# Patient Record
Sex: Male | Born: 1960 | Race: Black or African American | Hispanic: No | Marital: Married | Smoking: Former smoker
Health system: Southern US, Community
[De-identification: ages and names within clinical notes are randomized; demographics above are authoritative.]

## PROBLEM LIST (undated history)

## (undated) DIAGNOSIS — E041 Nontoxic single thyroid nodule: Secondary | ICD-10-CM

## (undated) HISTORY — DX: Nontoxic single thyroid nodule: E04.1

---

## 1992-03-28 HISTORY — PX: KNEE ARTHROSCOPY W/ MENISCECTOMY: SHX1879

## 2005-12-20 ENCOUNTER — Ambulatory Visit: Payer: Self-pay | Admitting: Family Medicine

## 2005-12-27 ENCOUNTER — Ambulatory Visit: Payer: Self-pay | Admitting: Family Medicine

## 2007-01-01 ENCOUNTER — Ambulatory Visit: Payer: Self-pay | Admitting: Family Medicine

## 2007-01-08 ENCOUNTER — Ambulatory Visit: Payer: Self-pay | Admitting: Family Medicine

## 2008-01-15 ENCOUNTER — Ambulatory Visit: Payer: Self-pay | Admitting: Family Medicine

## 2009-01-14 ENCOUNTER — Ambulatory Visit: Payer: Self-pay | Admitting: Family Medicine

## 2009-03-28 HISTORY — PX: COLONOSCOPY: SHX174

## 2010-01-14 ENCOUNTER — Encounter (INDEPENDENT_AMBULATORY_CARE_PROVIDER_SITE_OTHER): Payer: Self-pay | Admitting: *Deleted

## 2010-01-14 ENCOUNTER — Ambulatory Visit: Payer: Self-pay | Admitting: Family Medicine

## 2010-02-05 ENCOUNTER — Encounter (INDEPENDENT_AMBULATORY_CARE_PROVIDER_SITE_OTHER): Payer: Self-pay | Admitting: *Deleted

## 2010-02-09 ENCOUNTER — Ambulatory Visit: Payer: Self-pay | Admitting: Gastroenterology

## 2010-03-19 ENCOUNTER — Ambulatory Visit: Payer: Self-pay | Admitting: Gastroenterology

## 2010-03-23 LAB — HM COLONOSCOPY

## 2010-03-24 ENCOUNTER — Encounter: Payer: Self-pay | Admitting: Gastroenterology

## 2010-04-27 NOTE — Miscellaneous (Signed)
Summary: [facility]  Clinical Lists Changes  Medications: Added new medication of MOVIPREP 100 GM  SOLR (PEG-KCL-NACL-NASULF-NA ASC-C) As per prep instructions. - Signed Rx of MOVIPREP 100 GM  SOLR (PEG-KCL-NACL-NASULF-NA ASC-C) As per prep instructions.;  #1 x 0;  Signed;  Entered by: Ezra Sites RN;  Authorized by: Meryl Dare MD [facility];  Method used: [facility]  [address] [id number] [address] [place], [place], [place]  [address], Ph: [phone], Fax: [phone] Observations: Added new observation of NKA: T (02/09/2010 11:09)    Prescriptions: MOVIPREP 100 GM  SOLR (PEG-KCL-NACL-NASULF-NA ASC-C) As per prep instructions.  #1 x 0   Entered by:   Ezra Sites RN   Authorized by:   Meryl Dare MD [facility]   Signed by:   Ezra Sites RN on 02/09/2010   Method used:   [facility]        [facility]  [address] [id number]       [address]       [place], [place]  [address]       Ph: [phone]       Fax: [phone]   RxID:   [id number]

## 2010-04-27 NOTE — Letter (Signed)
Summary: Pre Visit Letter Revised  [facility]  [address] [place], [place] [address]   Phone: [phone]  Fax: [phone]        01/14/2010 MRN: [id number] [facility] [address] [place], [place]  [address]             Procedure Date:  03/02/2010   Welcome to the Gastroenterology Division at [facility].    You are scheduled to see a nurse for your pre-procedure visit on 02/09/2010 at 11:00AM on the 3rd floor at [facility], [address]  We ask that you try to arrive at our office 15 minutes prior to your appointment time to allow for check-in.  Please take a minute to review the attached form.  If you answer "Yes" to one or more of the questions on the first page, we ask that you call the person listed at your earliest opportunity.  If you answer "No" to all of the questions, please complete the rest of the form and bring it to your appointment.    Your nurse visit will consist of discussing your medical and surgical history, your immediate family medical history, and your medications.   If you are unable to list all of your medications on the form, please bring the medication bottles to your appointment and we will list them.  We will need to be aware of both prescribed and over the counter drugs.  We will need to know exact dosage information as well.    Please be prepared to read and sign documents such as consent forms, a financial agreement, and acknowledgement forms.  If necessary, and with your consent, a friend or relative is welcome to sit-in on the nurse visit with you.  Please bring your insurance card so that we may make a copy of it.  If your insurance requires a referral to see a specialist, please bring your referral form from your primary care physician.  No co-pay is required for this nurse visit.     If you cannot keep your appointment, please call [phone] to cancel or reschedule prior to your appointment date.  This  allows [place] the opportunity to schedule an appointment for another patient in need of care.    Thank you for choosing [facility] for your medical needs.  We appreciate the opportunity to care for you.  Please visit [place] at our website  to learn more about our practice.  Sincerely, The Gastroenterology Division

## 2010-04-27 NOTE — Letter (Signed)
Summary: [facility] Instructions  [facility]  [address] [place], [place] [address]   Phone: [phone]  Fax: [phone]       Benjamin Macias    Apr 25, 1960    MRN: [id number]        Procedure Day /Date: Friday 03/19/2010     Arrival Time: 12:30 pm     Procedure Time: 1:30 pm     Location of Procedure:                    _x _  [facility] (4th Floor)                        PREPARATION FOR COLONOSCOPY WITH MOVIPREP   Starting 5 days prior to your procedure  Sunday 12/18 do not eat nuts, seeds, popcorn, corn, beans, peas,  salads, or any raw vegetables.  Do not take any fiber supplements (e.g. Metamucil, Citrucel, and Benefiber).  THE DAY BEFORE YOUR PROCEDURE         DATE:  Thursday 12/22  1.  Drink clear liquids the entire day-NO SOLID FOOD  2.  Do not drink anything colored red or purple.  Avoid juices with pulp.  No orange juice.  3.  Drink at least 64 oz. (8 glasses) of fluid/clear liquids during the day to prevent dehydration and help the prep work efficiently.  CLEAR LIQUIDS INCLUDE: Water Jello Ice Popsicles Tea (sugar ok, no milk/cream) Powdered fruit flavored drinks Coffee (sugar ok, no milk/cream) Gatorade Juice: apple, white grape, white cranberry  Lemonade Clear bullion, consomm, broth Carbonated beverages (any kind) Strained chicken noodle soup Hard Candy                             4.  In the morning, mix first dose of MoviPrep solution:    Empty 1 Pouch A and 1 Pouch B into the disposable container    Add lukewarm drinking water to the top line of the container. Mix to dissolve    Refrigerate (mixed solution should be used within 24 hrs)  5.  Begin drinking the prep at 5:00 p.m. The MoviPrep container is divided by 4 marks.   Every 15 minutes drink the solution down to the next mark (approximately 8 oz) until the full liter is complete.   6.  Follow completed prep with 16 oz of clear liquid of your choice  (Nothing red or purple).  Continue to drink clear liquids until bedtime.  7.  Before going to bed, mix second dose of MoviPrep solution:    Empty 1 Pouch A and 1 Pouch B into the disposable container    Add lukewarm drinking water to the top line of the container. Mix to dissolve    Refrigerate  THE DAY OF YOUR PROCEDURE      DATE:  Friday 12/23  Beginning at  8:30 a.m. (5 hours before procedure):         1. Every 15 minutes, drink the solution down to the next mark (approx 8 oz) until the full liter is complete.  2. Follow completed prep with 16 oz. of clear liquid of your choice.    3. You may drink clear liquids until 11:30 am  (2 HOURS BEFORE PROCEDURE).   MEDICATION INSTRUCTIONS  Unless otherwise instructed, you should take regular prescription medications with a small sip of water   as early as possible  the morning of your procedure.  Diabetic patients - see separate instructions.  Stop taking Plavix or Aggrenox on  _  _  (7 days before procedure).     Stop taking Coumadin on  _ _  (5 days before procedure).  Additional medication instructions: _         OTHER INSTRUCTIONS  You will need a responsible adult at least 51 years of age to accompany you and drive you home.   This person must remain in the waiting room during your procedure.  Wear loose fitting clothing that is easily removed.  Leave jewelry and other valuables at home.  However, you may wish to bring a book to read or  an iPod/MP3 player to listen to music as you wait for your procedure to start.  Remove all body piercing jewelry and leave at home.  Total time from sign-in until discharge is approximately 2-3 hours.  You should go home directly after your procedure and rest.  You can resume normal activities the  day after your procedure.  The day of your procedure you should not:   Drive   Make legal decisions   Operate machinery   Drink alcohol   Return to work  You will receive  specific instructions about eating, activities and medications before you leave.    The above instructions have been reviewed and explained to me by   Ezra Sites RN  February 09, 2010 11:31 AM    I fully understand and can verbalize these instructions _____________________________ Date _________

## 2010-04-29 NOTE — Letter (Signed)
Summary: Patient Notice-Hyperplastic Polyps  [facility]  [address] [place], [place] [address]   Phone: [phone]  Fax: [phone]        March 24, 2010 MRN: [id number]    [facility] [address] [place], [place]  [address]    Dear Benjamin Macias,  I am pleased to inform you that the colon polyp(s) removed during your recent colonoscopy was (were) found to be hyperplastic. These types of polyps are NOT pre-cancerous.  It is my recommendation that you have a repeat colonoscopy examination in 10 years for routine colorectal cancer screening.  Should you develop new or worsening symptoms of abdominal pain, bowel habit changes or bleeding from the rectum or bowels, please schedule an evaluation with either your primary care physician or with me.  Continue treatment plan as outlined the day of your exam.  Please call us if you are having persistent problems or have questions about your condition that have not been fully answered at this time.  Sincerely,  Meryl Dare MD [facility]  This letter has been electronically signed by your physician.  Appended Document: Patient Notice-Hyperplastic Polyps Letter mailed

## 2010-04-29 NOTE — Procedures (Signed)
Summary: Colonoscopy  Patient: Benjamin Macias Note: All result statuses are Final unless otherwise noted.  Tests: (1) Colonoscopy (COL)   COL Colonoscopy           DONE     [facility]     [address]     [place], [place]  [address]           COLONOSCOPY PROCEDURE REPORT     PATIENT:  Benjamin Macias, Benjamin Macias  MR#:  [id number]     BIRTHDATE:  [birth year], 49 yrs. old  GENDER:  male     ENDOSCOPIST:  Judie Petit T. Russella Dar, MD, [facility]     Referred by:  Sharlot Gowda, M.D.     PROCEDURE DATE:  03/19/2010     PROCEDURE:  Colonoscopy with biopsy     ASA CLASS:  Class I     INDICATIONS:  1) Routine Risk Screening     MEDICATIONS:   Fentanyl 75 mcg IV, Versed 5 mg IV     DESCRIPTION OF PROCEDURE:   After the risks benefits and     alternatives of the procedure were thoroughly explained, informed     consent was obtained.  Digital rectal exam was performed and     revealed no abnormalities.   The LB PCF-H180AL [id number] endoscope     was introduced through the anus and advanced to the cecum, which     was identified by both the appendix and ileocecal valve, without     limitations.  The quality of the prep was excellent, using     MoviPrep.  The instrument was then slowly withdrawn as the colon     was fully examined.     <<PROCEDUREIMAGES>>     FINDINGS:  Two polyps were found in the sigmoid colon. They were 3     - 4 mm in size. The polyps was removed using cold biopsy forceps.     A normal appearing cecum, ileocecal valve, and appendiceal orifice     were identified. The ascending, hepatic flexure, transverse,     splenic flexure, descending colon, and rectum appeared     unremarkable. Retroflexed views in the rectum revealed internal     hemorrhoids, small.  The time to cecum =  1.5  minutes. The scope     was then withdrawn (time =  9  min) from the patient and the     procedure completed.           COMPLICATIONS:  None           ENDOSCOPIC IMPRESSION:     1) 3 - 4 mm Two polyps in the  sigmoid colon     2) Internal hemorrhoids     RECOMMENDATIONS:     1) Await pathology results     2) If the polyps are adenomatous, colonoscopy in 5 years.     Otherwise follow colorectal cancer screening guidelines for     "routine risk" patients with colonoscopy in 10 years.           Venita Lick. Russella Dar, MD, [facility]           n.     eSIGNED:   Venita Lick. Baran Kuhrt at 03/19/2010 01:48 PM           Brawley, Vernice Jefferson, [phone]  Note: An exclamation mark (!) indicates a result that was not dispersed into the flowsheet. Document Creation Date: 03/19/2010 1:48 PM _______________________________________________________________________  (1) Order result status: Final Collection or observation date-time:  03/19/2010 13:43 Requested date-time:  Receipt date-time:  Reported date-time:  Referring Physician:   Ordering Physician: Claudette Head [phone]) Specimen Source:  Source: Launa Grill Order Number: [id number] Lab site:   Appended Document: Colonoscopy     Procedures Next Due Date:    Colonoscopy: 02/2020

## 2010-10-20 ENCOUNTER — Encounter: Payer: Self-pay | Admitting: Family Medicine

## 2010-12-10 ENCOUNTER — Encounter: Payer: Self-pay | Admitting: Medical

## 2010-12-10 ENCOUNTER — Ambulatory Visit (INDEPENDENT_AMBULATORY_CARE_PROVIDER_SITE_OTHER): Payer: 59 | Admitting: Medical

## 2010-12-10 VITALS — BP 158/90 | HR 84 | Ht 71.0 in | Wt 216.0 lb

## 2010-12-10 DIAGNOSIS — R42 Dizziness and giddiness: Secondary | ICD-10-CM

## 2010-12-10 DIAGNOSIS — R110 Nausea: Secondary | ICD-10-CM

## 2010-12-10 DIAGNOSIS — R030 Elevated blood-pressure reading, without diagnosis of hypertension: Secondary | ICD-10-CM

## 2010-12-10 MED ORDER — PROMETHAZINE HCL 25 MG PO TABS: 25.0000 mg | ORAL_TABLET | Freq: Four times a day (QID) | ORAL | Status: AC | PRN

## 2010-12-10 MED ORDER — MECLIZINE HCL 25 MG PO TABS: ORAL_TABLET | ORAL | Status: DC

## 2010-12-10 NOTE — Progress Notes (Signed)
  Subjective:   HPI  Benjamin Macias is a 50 y.o. male who presents for 3-4 days of dizziness.  He notes that symptoms started 3 days ago with mild "cold" symptoms, runny nose, congestion.  He took Nyquil the first evening.  The next morning felt dizzy when he got up out of the bed.  Had to sit back down on the bed.  After a few seconds, the dizziness improved, and after using the bathroom and taking his shower, he felt ok.  The dizziness has continued every morning the last few days though.  He has also felt quite nauseated.   He had nurse check his BP at work today and it was 160/102.  He feels ok at the moment, but wanted to come in and get this checked out.  No other aggravating or relieving factors.  No other c/o.  The following portions of the patient's history were reviewed and updated as appropriate: allergies, current medications, past family history, past medical history, past social history, past surgical history and problem list.  No past medical history on file.  Review of Systems Gen: subj fever, no chills, sweats, no recent weight changes Skin: no rash Heart: no CP, palpitations, edema, no orthopnea Lungs: mild cough, mild phlegm; SOB, wheezing GI: no abdominal pain, vomiting, diarrhea, no blood in stool GU: no dysuria, frequency, urgency, hematuria Neuro: no headache, numbness, tingling, no speech changes, no LOC     Objective:   Physical Exam  General appearance: alert, no distress, WD/WN Skin: not diaphoretic, no rash HEENT: normocephalic, sclerae anicteric, PERRLA, EOMi, nares with slight turbinate swelling and clear discharge, pharynx normal Oral cavity: MMM, no lesions Neck: supple, no lymphadenopathy, no thyromegaly, no masses, no bruits Heart: RRR, normal S1, S2, no murmurs Lungs: CTA bilaterally, no wheezes, rhonchi, or rales Abdomen: +bs, soft, non tender, non distended, no masses, no hepatomegaly, no splenomegaly, no bruit Extremities: no edema, no cyanosis, no  clubbing Pulses: 2+ symmetric, upper and lower extremities, normal cap refill Neurological: alert, oriented x 3, CN2-12 intact, no focal signs, no cerebellar signs, gait normal Psychiatric: normal affect, behavior normal, pleasant    Assessment :    Encounter Diagnoses  Name Primary?  . Dizziness Yes  . Vertigo   . Nausea   . Elevated blood-pressure reading without diagnosis of hypertension      Plan:    Advised that symptoms suggest viral URI that is likely contributing to his dizziness/Vertigo and nausea symptoms.  Exam is unremarkable and symptoms don't suggest any other more worrisome cause today. Advised he rest over the weekend, hydrate well, begin Meclizine BID - TID for 1-2 weeks, phenergan only if needed, and if not improving or worse by Monday call or return.  Discussed signs of Stroke and acute coronary syndrome that would prompt 911 call, but reassured that his current symptoms and exam didn't suggest anything more worrisome.  Elevated BP - Looking back in the chart, he has fluctuated from normal to up to 160/90 BP, but the last several visits was closer to normal.  He will have nurse at work check his BP every few days.  He will write these numbers down and bring them in to his upcoming yearly physical visit.

## 2010-12-10 NOTE — Patient Instructions (Signed)
Hydrate well with water, rest, avoid sudden movements or getting up too fast from seated or lying position.    For cough, congestion, or sinus pressure, use Mucinex DM or Coricidin HBP or OTC Zyrtec as needed.   Avoid sudafed and other OTC decongestants.  I wrote 2 prescriptions: 1 for Meclizine.  You can use this 2-3 times daily for vertigo/dizziness.    The second prescription is for Phenergan. This is for nausea and vomiting, every 6 hours if needed.  This may cause sedation  Call Monday if not improving.

## 2011-01-18 ENCOUNTER — Encounter: Payer: Self-pay | Admitting: Family Medicine

## 2011-01-18 ENCOUNTER — Ambulatory Visit (INDEPENDENT_AMBULATORY_CARE_PROVIDER_SITE_OTHER): Payer: 59 | Admitting: Family Medicine

## 2011-01-18 VITALS — BP 132/90 | HR 80 | Ht 69.25 in | Wt 217.0 lb

## 2011-01-18 DIAGNOSIS — Z0000 Encounter for general adult medical examination without abnormal findings: Secondary | ICD-10-CM

## 2011-01-18 DIAGNOSIS — Z23 Encounter for immunization: Secondary | ICD-10-CM

## 2011-01-18 DIAGNOSIS — Z125 Encounter for screening for malignant neoplasm of prostate: Secondary | ICD-10-CM

## 2011-01-18 LAB — POCT URINALYSIS DIPSTICK
Bilirubin, UA: NEGATIVE
Glucose, UA: NEGATIVE
Ketones, UA: NEGATIVE
Leukocytes, UA: NEGATIVE
Nitrite, UA: NEGATIVE

## 2011-01-18 LAB — CBC WITH DIFFERENTIAL/PLATELET
Hemoglobin: 13.9 g/dL (ref 13.0–17.0)
Lymphs Abs: 2.5 10*3/uL (ref 0.7–4.0)
Monocytes Relative: 9 % (ref 3–12)
Neutro Abs: 1.8 10*3/uL (ref 1.7–7.7)
Neutrophils Relative %: 37 % — ABNORMAL LOW (ref 43–77)
RBC: 5.06 MIL/uL (ref 4.22–5.81)

## 2011-01-18 LAB — COMPREHENSIVE METABOLIC PANEL
Albumin: 4.5 g/dL (ref 3.5–5.2)
Alkaline Phosphatase: 45 U/L (ref 39–117)
CO2: 25 mEq/L (ref 19–32)
Chloride: 103 mEq/L (ref 96–112)
Glucose, Bld: 87 mg/dL (ref 70–99)
Potassium: 4.1 mEq/L (ref 3.5–5.3)
Sodium: 138 mEq/L (ref 135–145)
Total Protein: 7.2 g/dL (ref 6.0–8.3)

## 2011-01-18 LAB — LIPID PANEL
Cholesterol: 212 mg/dL — ABNORMAL HIGH (ref 0–200)
LDL Cholesterol: 150 mg/dL — ABNORMAL HIGH (ref 0–99)
Triglycerides: 115 mg/dL (ref <150)

## 2011-01-18 NOTE — Progress Notes (Signed)
  Subjective:    Patient ID: Benjamin Macias, male    DOB: May 28, 1960, 50 y.o.   MRN: [id number]  HPI He is here for complete examination. He is not having any more trouble with dizziness and presently is on no medication for this. He has no particular concerns or complaints. He is on no medications. His work and home life are going quite well. He has 2 children in their 22s. He exercises 3 times per week. His social and family history were reviewed and are in the chart  Review of Systems  Constitutional: Negative.   HENT: Negative.   Eyes: Negative.   Respiratory: Negative.   Genitourinary: Negative.   Musculoskeletal: Negative.   Skin: Negative.   Neurological: Negative.   Hematological: Negative.   Psychiatric/Behavioral: Negative.   All other systems reviewed and are negative.       Objective:   Physical Exam BP 132/90  Pulse 80  Ht 5' 9.25" (1.759 m)  Wt 217 lb (98.431 kg)  BMI 31.81 kg/m2  General Appearance:    Alert, cooperative, no distress, appears stated age  Head:    Normocephalic, without obvious abnormality, atraumatic  Eyes:    PERRL, conjunctiva/corneas clear, EOM's intact, fundi    benign  Ears:    Normal TM's and external ear canals  Nose:   Nares normal, mucosa normal, no drainage or sinus   tenderness  Throat:   Lips, mucosa, and tongue normal; teeth and gums normal  Neck:   Supple, no lymphadenopathy;  thyroid:  no   enlargement/tenderness/nodules; no carotid   bruit or JVD  Back:    Spine nontender, no curvature, ROM normal, no CVA     tenderness  Lungs:     Clear to auscultation bilaterally without wheezes, rales or     ronchi; respirations unlabored  Chest Wall:    No tenderness or deformity   Heart:    Regular rate and rhythm, S1 and S2 normal, no murmur, rub   or gallop  Breast Exam:    No chest wall tenderness, masses or gynecomastia  Abdomen:     Soft, non-tender, nondistended, normoactive bowel sounds,    no masses, no hepatosplenomegaly       Extremities:   No clubbing, cyanosis or edema  Pulses:   2+ and symmetric all extremities  Skin:   Skin color, texture, turgor normal, no rashes or lesions  Lymph nodes:   Cervical, supraclavicular, and axillary nodes normal  Neurologic:   CNII-XII intact, normal strength, sensation and gait; reflexes 2+ and symmetric throughout          Psych:   Normal mood, affect, hygiene and grooming.     EKG is normal Assessment & Plan:   1. Physical exam, annual  POCT Urinalysis Dipstick, CBC with Differential, Comprehensive metabolic panel, Lipid panel, PSA, Flu vaccine greater than or equal to 3yo with preservative IM, PR ELECTROCARDIOGRAM, COMPLETE  2. Special screening for malignant neoplasm of prostate  PSA   encouraged him to continue to take good care of himself.

## 2011-01-18 NOTE — Patient Instructions (Signed)
Continue to take good care of yourself 

## 2011-11-30 ENCOUNTER — Encounter: Payer: Self-pay | Admitting: Internal Medicine

## 2012-01-19 ENCOUNTER — Ambulatory Visit (INDEPENDENT_AMBULATORY_CARE_PROVIDER_SITE_OTHER): Payer: 59 | Admitting: Family Medicine

## 2012-01-19 ENCOUNTER — Encounter: Payer: Self-pay | Admitting: Family Medicine

## 2012-01-19 VITALS — BP 124/86 | HR 71 | Ht 70.0 in | Wt 219.0 lb

## 2012-01-19 DIAGNOSIS — Z0000 Encounter for general adult medical examination without abnormal findings: Secondary | ICD-10-CM

## 2012-01-19 DIAGNOSIS — Z125 Encounter for screening for malignant neoplasm of prostate: Secondary | ICD-10-CM

## 2012-01-19 DIAGNOSIS — Z23 Encounter for immunization: Secondary | ICD-10-CM

## 2012-01-19 DIAGNOSIS — E739 Lactose intolerance, unspecified: Secondary | ICD-10-CM

## 2012-01-19 LAB — POCT URINALYSIS DIPSTICK
Blood, UA: NEGATIVE
Glucose, UA: NEGATIVE
Ketones, UA: NEGATIVE
Protein, UA: NEGATIVE
Spec Grav, UA: 1.015

## 2012-01-19 LAB — LIPID PANEL
Cholesterol: 183 mg/dL (ref 0–200)
Total CHOL/HDL Ratio: 4.6 Ratio
Triglycerides: 71 mg/dL (ref <150)
VLDL: 14 mg/dL (ref 0–40)

## 2012-01-19 NOTE — Progress Notes (Signed)
  Subjective:    Patient ID: Benjamin Macias, male    DOB: October 27, 1960, 51 y.o.   MRN: [id number]  HPI He is here for complete examination. He has enjoyed excellent health. He has noted recently difficulty with dairy products causing abdominal pain and bloating. He has tried acidophilus which did relieve his symptoms. Otherwise he has no particular concerns or questions. His social and family history were reviewed. He exercises regularly and does wear his seatbelt.   Review of Systems  Constitutional: Negative.   HENT: Negative.   Eyes: Negative.   Respiratory: Negative.   Cardiovascular: Negative.   Gastrointestinal: Negative.   Genitourinary: Negative.   Musculoskeletal: Negative.   Skin: Negative.   Neurological: Negative.   Hematological: Negative.   Psychiatric/Behavioral: Negative.        Objective:   Physical Exam BP 124/86  Pulse 71  Ht 5\' 10"  (1.778 m)  Wt 219 lb (99.338 kg)  BMI 31.42 kg/m2  General Appearance:    Alert, cooperative, no distress, appears stated age  Head:    Normocephalic, without obvious abnormality, atraumatic  Eyes:    PERRL, conjunctiva/corneas clear, EOM's intact, fundi    Difficult to view (he has had an eye exam in the last year )  Ears:    Normal TM's and external ear canals  Nose:   Nares normal, mucosa normal, no drainage or sinus   tenderness  Throat:   Lips, mucosa, and tongue normal; teeth and gums normal  Neck:   Supple, no lymphadenopathy;  thyroid:  no   enlargement/tenderness/nodules; no carotid   bruit or JVD  Back:    Spine nontender, no curvature, ROM normal, no CVA     tenderness  Lungs:     Clear to auscultation bilaterally without wheezes, rales or     ronchi; respirations unlabored  Chest Wall:    No tenderness or deformity   Heart:    Regular rate and rhythm, S1 and S2 normal, no murmur, rub   or gallop  Breast Exam:    No chest wall tenderness, masses or gynecomastia  Abdomen:     Soft, non-tender, nondistended, normoactive  bowel sounds,    no masses, no hepatosplenomegaly  Genitalia:   deferred   Rectal:   deferred   Extremities:   No clubbing, cyanosis or edema  Pulses:   2+ and symmetric all extremities  Skin:   Skin color, texture, turgor normal, no rashes or lesions  Lymph nodes:   Cervical, supraclavicular, and axillary nodes normal  Neurologic:   CNII-XII intact, normal strength, sensation and gait; reflexes 2+ and symmetric throughout          Psych:   Normal mood, affect, hygiene and grooming.           Assessment & Plan:   1. Routine general medical examination at a health care facility  POCT Urinalysis Dipstick, Lipid panel  2. Need for prophylactic vaccination and inoculation against influenza  Flu vaccine greater than or equal to 3yo preservative free IM  3. Lactose intolerance    4. Special screening for malignant neoplasm of prostate  PSA   flu shot given with risks and benefits discussed

## 2013-01-22 ENCOUNTER — Ambulatory Visit (INDEPENDENT_AMBULATORY_CARE_PROVIDER_SITE_OTHER): Payer: 59 | Admitting: Family Medicine

## 2013-01-22 ENCOUNTER — Encounter: Payer: Self-pay | Admitting: Family Medicine

## 2013-01-22 VITALS — BP 120/78 | HR 71 | Ht 70.0 in | Wt 195.0 lb

## 2013-01-22 DIAGNOSIS — Z23 Encounter for immunization: Secondary | ICD-10-CM

## 2013-01-22 DIAGNOSIS — E739 Lactose intolerance, unspecified: Secondary | ICD-10-CM

## 2013-01-22 DIAGNOSIS — Z0000 Encounter for general adult medical examination without abnormal findings: Secondary | ICD-10-CM

## 2013-01-22 LAB — HEMOCCULT GUIAC POC 1CARD (OFFICE)

## 2013-01-22 LAB — CBC WITH DIFFERENTIAL/PLATELET
Basophils Absolute: 0 10*3/uL (ref 0.0–0.1)
Basophils Relative: 0 % (ref 0–1)
Eosinophils Relative: 1 % (ref 0–5)
HCT: 40.5 % (ref 39.0–52.0)
MCH: 28.1 pg (ref 26.0–34.0)
MCHC: 33.8 g/dL (ref 30.0–36.0)
MCV: 83 fL (ref 78.0–100.0)
Monocytes Absolute: 0.4 10*3/uL (ref 0.1–1.0)
RDW: 14.4 % (ref 11.5–15.5)

## 2013-01-22 LAB — POCT URINALYSIS DIPSTICK
Ketones, UA: NEGATIVE
Protein, UA: NEGATIVE
Spec Grav, UA: 1.02
pH, UA: 5

## 2013-01-22 NOTE — Progress Notes (Signed)
  Subjective:    Patient ID: Benjamin Macias, male    DOB: September 24, 1960, 52 y.o.   MRN: [id number]  HPI He is here for complete examination. He does have a history of lactose intolerance but is using Lactaid with good results. He has no other concerns or complaints. His work and home life are going well. Family and social history is essentially unchanged. He is considering retirement in about 3 years. His wife is now retired from [employer].   Review of Systems  Constitutional: Negative.   HENT: Negative.   Eyes: Negative.   Respiratory: Negative.   Cardiovascular: Negative.   Gastrointestinal: Negative.   Endocrine: Negative.   Genitourinary: Negative.   Musculoskeletal: Negative.   Allergic/Immunologic: Negative.   Neurological: Negative.   Hematological: Negative.   Psychiatric/Behavioral: Negative.        Objective:   Physical Exam BP 120/78  Pulse 71  Ht 5\' 10"  (1.778 m)  Wt 195 lb (88.451 kg)  BMI 27.98 kg/m2  SpO2 98%  General Appearance:    Alert, cooperative, no distress, appears stated age  Head:    Normocephalic, without obvious abnormality, atraumatic  Eyes:    PERRL, conjunctiva/corneas clear, EOM's intact, fundi    benign  Ears:    Normal TM's and external ear canals  Nose:   Nares normal, mucosa normal, no drainage or sinus   tenderness  Throat:   Lips, mucosa, and tongue normal; teeth and gums normal  Neck:   Supple, no lymphadenopathy;  thyroid:  no   enlargement/tenderness/nodules; no carotid   bruit or JVD  Back:    Spine nontender, no curvature, ROM normal, no CVA     tenderness  Lungs:     Clear to auscultation bilaterally without wheezes, rales or     ronchi; respirations unlabored  Chest Wall:    No tenderness or deformity   Heart:    Regular rate and rhythm, S1 and S2 normal, no murmur, rub   or gallop  Breast Exam:    No chest wall tenderness, masses or gynecomastia  Abdomen:     Soft, non-tender, nondistended, normoactive bowel sounds,    no masses,  no hepatosplenomegaly  Genitalia:    Normal male external genitalia without lesions.  Testicles without masses.  No inguinal hernias.  Rectal:    Normal sphincter tone, no masses or tenderness; guaiac negative stool.  Prostate smooth, no nodules, not enlarged.  Extremities:   No clubbing, cyanosis or edema  Pulses:   2+ and symmetric all extremities  Skin:   Skin color, texture, turgor normal, no rashes or lesions  Lymph nodes:   Cervical, supraclavicular, and axillary nodes normal  Neurologic:   CNII-XII intact, normal strength, sensation and gait; reflexes 2+ and symmetric throughout          Psych:   Normal mood, affect, hygiene and grooming.          Assessment & Plan:  Routine general medical examination at a health care facility - Plan: POCT Urinalysis Dipstick, CBC with Differential, Comprehensive metabolic panel, Lipid panel  Need for prophylactic vaccination and inoculation against influenza - Plan: Flu Vaccine QUAD 36+ mos PF IM (Fluarix)  Lactose intolerance  I encouraged him to take good care of himself. Flu shot given with risks and benefits discussed.

## 2013-01-23 LAB — LIPID PANEL
Cholesterol: 164 mg/dL (ref 0–200)
HDL: 52 mg/dL (ref >39)
Total CHOL/HDL Ratio: 3.2 Ratio
Triglycerides: 59 mg/dL (ref <150)
VLDL: 12 mg/dL (ref 0–40)

## 2013-01-23 LAB — COMPREHENSIVE METABOLIC PANEL
AST: 23 U/L (ref 0–37)
Alkaline Phosphatase: 37 U/L — ABNORMAL LOW (ref 39–117)
BUN: 21 mg/dL (ref 6–23)
Calcium: 9.5 mg/dL (ref 8.4–10.5)
Chloride: 102 mEq/L (ref 96–112)
Creat: 1.15 mg/dL (ref 0.50–1.35)

## 2014-01-23 ENCOUNTER — Encounter: Payer: Self-pay | Admitting: Family Medicine

## 2014-01-23 ENCOUNTER — Ambulatory Visit (INDEPENDENT_AMBULATORY_CARE_PROVIDER_SITE_OTHER): Payer: 59 | Admitting: Family Medicine

## 2014-01-23 VITALS — BP 122/86 | HR 70 | Ht 69.5 in | Wt 214.0 lb

## 2014-01-23 DIAGNOSIS — B079 Viral wart, unspecified: Secondary | ICD-10-CM

## 2014-01-23 DIAGNOSIS — L253 Unspecified contact dermatitis due to other chemical products: Secondary | ICD-10-CM

## 2014-01-23 DIAGNOSIS — Z0000 Encounter for general adult medical examination without abnormal findings: Secondary | ICD-10-CM

## 2014-01-23 DIAGNOSIS — E739 Lactose intolerance, unspecified: Secondary | ICD-10-CM

## 2014-01-23 DIAGNOSIS — Z23 Encounter for immunization: Secondary | ICD-10-CM

## 2014-01-23 LAB — POCT URINALYSIS DIPSTICK
Bilirubin, UA: NEGATIVE
Glucose, UA: NEGATIVE
KETONES UA: NEGATIVE
Leukocytes, UA: NEGATIVE
Nitrite, UA: NEGATIVE
PH UA: 5
PROTEIN UA: NEGATIVE
RBC UA: NEGATIVE
SPEC GRAV UA: 1.015
Urobilinogen, UA: NEGATIVE

## 2014-01-23 LAB — CBC WITH DIFFERENTIAL/PLATELET
BASOS ABS: 0.1 10*3/uL (ref 0.0–0.1)
BASOS PCT: 1 % (ref 0–1)
EOS PCT: 3 % (ref 0–5)
Eosinophils Absolute: 0.2 10*3/uL (ref 0.0–0.7)
HCT: 42.2 % (ref 39.0–52.0)
Hemoglobin: 13.8 g/dL (ref 13.0–17.0)
Lymphocytes Relative: 50 % — ABNORMAL HIGH (ref 12–46)
Lymphs Abs: 2.8 10*3/uL (ref 0.7–4.0)
MCH: 27.4 pg (ref 26.0–34.0)
MCHC: 32.7 g/dL (ref 30.0–36.0)
MCV: 83.7 fL (ref 78.0–100.0)
MONO ABS: 0.4 10*3/uL (ref 0.1–1.0)
Monocytes Relative: 8 % (ref 3–12)
Neutro Abs: 2.1 10*3/uL (ref 1.7–7.7)
Neutrophils Relative %: 38 % — ABNORMAL LOW (ref 43–77)
Platelets: 170 10*3/uL (ref 150–400)
RBC: 5.04 MIL/uL (ref 4.22–5.81)
RDW: 14.1 % (ref 11.5–15.5)
WBC: 5.5 10*3/uL (ref 4.0–10.5)

## 2014-01-23 NOTE — Progress Notes (Signed)
   Subjective:    Patient ID: Benjamin Macias, male    DOB: [birth year], 53 y.o.   MRN: [id number]  HPI He is here for complete examination. He does have 2 warts present on his right hand he would like evaluated. He is also had difficulty with itching in the underwear line as well as on his neck. He does have lactose intolerance and handles this well Otherwise he has no particular concerns or complaints. Family and social history was reviewed. Work is going well. He has 2 children in their 7920s. He keeps himself physically active. He plans to work for couple more years and then retire but still stay active.   Review of Systems  All other systems reviewed and are negative.      Objective:   Physical Exam BP 122/86  Pulse 70  Ht 5' 9.5" (1.765 m)  Wt 214 lb (97.07 kg)  BMI 31.16 kg/m2  SpO2 98%  General Appearance:    Alert, cooperative, no distress, appears stated age  Head:    Normocephalic, without obvious abnormality, atraumatic  Eyes:    PERRL, conjunctiva/corneas clear, EOM's intact, fundi    benign  Ears:    Normal TM's and external ear canals  Nose:   Nares normal, mucosa normal, no drainage or sinus   tenderness  Throat:   Lips, mucosa, and tongue normal; teeth and gums normal  Neck:   Supple, no lymphadenopathy;  thyroid:  no   enlargement/tenderness/nodules; no carotid   bruit or JVD  Back:    Spine nontender, no curvature, ROM normal, no CVA     tenderness  Lungs:     Clear to auscultation bilaterally without wheezes, rales or     ronchi; respirations unlabored  Chest Wall:    No tenderness or deformity   Heart:    Regular rate and rhythm, S1 and S2 normal, no murmur, rub   or gallop  Breast Exam:    No chest wall tenderness, masses or gynecomastia  Abdomen:     Soft, non-tender, nondistended, normoactive bowel sounds,    no masses, no hepatosplenomegaly        Extremities:   No clubbing, cyanosis or edema  Pulses:   2+ and symmetric all extremities  Skin:   Skin color,  texture, turgor normal. exam of the underwear line shows no erythema. He does have 2 small warts on his right hand,  Lymph nodes:   Cervical, supraclavicular, and axillary nodes normal  Neurologic:   CNII-XII intact, normal strength, sensation and gait; reflexes 2+ and symmetric throughout          Psych:   Normal mood, affect, hygiene and grooming.          Assessment & Plan:  Routine general medical examination at a health care facility - Plan: POCT Urinalysis Dipstick, CBC with Differential, Comprehensive metabolic panel, Lipid panel  Need for prophylactic vaccination and inoculation against influenza - Plan: Flu Vaccine QUAD 36+ mos PF IM (Fluarix Quad PF)  Warts  Latex allergy, contact dermatitis  Lactose intolerance  I discussed treatment of the warts with Compound W and doing this on a regular weekly basis. Also discussed the issue with latex explaining that this is not thing we can cure but control. Recommended cortisone cream and also white cotton underwear against his skin and then his under shorts on top of that.

## 2014-01-24 LAB — COMPREHENSIVE METABOLIC PANEL
ALK PHOS: 44 U/L (ref 39–117)
ALT: 36 U/L (ref 0–53)
AST: 31 U/L (ref 0–37)
Albumin: 4.6 g/dL (ref 3.5–5.2)
BUN: 15 mg/dL (ref 6–23)
CALCIUM: 9.1 mg/dL (ref 8.4–10.5)
CHLORIDE: 102 meq/L (ref 96–112)
CO2: 29 mEq/L (ref 19–32)
Creat: 1.05 mg/dL (ref 0.50–1.35)
GLUCOSE: 98 mg/dL (ref 70–99)
Potassium: 4 mEq/L (ref 3.5–5.3)
Sodium: 139 mEq/L (ref 135–145)
Total Bilirubin: 0.5 mg/dL (ref 0.2–1.2)
Total Protein: 7 g/dL (ref 6.0–8.3)

## 2014-01-24 LAB — LIPID PANEL
Cholesterol: 201 mg/dL — ABNORMAL HIGH (ref 0–200)
HDL: 48 mg/dL (ref >39)
LDL Cholesterol: 139 mg/dL — ABNORMAL HIGH (ref 0–99)
TRIGLYCERIDES: 71 mg/dL (ref <150)
Total CHOL/HDL Ratio: 4.2 Ratio
VLDL: 14 mg/dL (ref 0–40)

## 2015-01-26 ENCOUNTER — Ambulatory Visit (INDEPENDENT_AMBULATORY_CARE_PROVIDER_SITE_OTHER): Payer: 59 | Admitting: Family Medicine

## 2015-01-26 ENCOUNTER — Encounter: Payer: Self-pay | Admitting: Family Medicine

## 2015-01-26 VITALS — BP 114/70 | HR 83 | Ht 70.0 in | Wt 219.0 lb

## 2015-01-26 DIAGNOSIS — Z1159 Encounter for screening for other viral diseases: Secondary | ICD-10-CM

## 2015-01-26 DIAGNOSIS — Z23 Encounter for immunization: Secondary | ICD-10-CM | POA: Diagnosis not present

## 2015-01-26 DIAGNOSIS — Z0000 Encounter for general adult medical examination without abnormal findings: Secondary | ICD-10-CM

## 2015-01-26 LAB — POCT URINALYSIS DIPSTICK
Bilirubin, UA: NEGATIVE
Blood, UA: NEGATIVE
GLUCOSE UA: NEGATIVE
Leukocytes, UA: NEGATIVE
Nitrite, UA: NEGATIVE
Urobilinogen, UA: NEGATIVE
pH, UA: 6

## 2015-01-26 LAB — LIPID PANEL
CHOL/HDL RATIO: 4.4 ratio (ref <=5.0)
Cholesterol: 176 mg/dL (ref 125–200)
HDL: 40 mg/dL (ref >=40)
LDL Cholesterol: 123 mg/dL (ref <130)
Triglycerides: 66 mg/dL (ref <150)
VLDL: 13 mg/dL (ref <30)

## 2015-01-26 NOTE — Progress Notes (Signed)
   Subjective:    Patient ID: Benjamin Macias, male    DOB: 12-11-1960, 54 y.o.   MRN: [id number]  HPI He is here for a complete examination. He has no particular concerns or complaints. Family history is unchanged. Social history health maintenance and immunizations were reviewed. His work and home life are both going well. He does not smoke. He is considering retirement and roughly 16 months. He's had no chest pain, shortness breath, GI or GU issues. Work is going well. He does exercise regularly.   Review of Systems  All other systems reviewed and are negative.      Objective:   Physical Exam BP 114/70 mmHg  Pulse 83  Ht 5\' 10"  (1.778 m)  Wt 219 lb (99.338 kg)  BMI 31.42 kg/m2  SpO2 99%  General Appearance:    Alert, cooperative, no distress, appears stated age  Head:    Normocephalic, without obvious abnormality, atraumatic  Eyes:    PERRL, conjunctiva/corneas clear, EOM's intact, fundi    benign  Ears:    Normal TM's and exte I encouraged him to continue to take good care of himself.rnal ear canals  Nose:   Nares normal, mucosa normal, no drainage or sinus   tenderness  Throat:   Lips, mucosa, and tongue normal; teeth and gums normal  Neck:   Supple, no lymphadenopathy;  thyroid:  no   enlargement/tenderness/nodules; no carotid   bruit or JVD  Back:    Spine nontender, no curvature, ROM normal, no CVA     tenderness  Lungs:     Clear to auscultation bilaterally without wheezes, rales or     ronchi; respirations unlabored  Chest Wall:    No tenderness or deformity   Heart:    Regular rate and rhythm, S1 and S2 normal, no murmur, rub   or gallop  Breast Exam:    No chest wall tenderness, masses or gynecomastia  Abdomen:     Soft, non-tender, nondistended, normoactive bowel sounds,    no masses, no hepatosplenomegaly        Extremities:   No clubbing, cyanosis or edema  Pulses:   2+ and symmetric all extremities  Skin:   Skin color, texture, turgor normal, no rashes or  lesions  Lymph nodes:   Cervical, supraclavicular, and axillary nodes normal  Neurologic:   CNII-XII intact, normal strength, sensation and gait; reflexes 2+ and symmetric throughout          Psych:   Normal mood, affect, hygiene and grooming.           Assessment & Plan:  Routine general medical examination at a health care facility - Plan: POCT Urinalysis Dipstick, Lipid panel  Need for prophylactic vaccination and inoculation against influenza - Plan: Flu Vaccine QUAD 36+ mos PF IM (Fluarix & Fluzone Quad PF)  Need for hepatitis C screening test - Plan: Hepatitis C Antibody   I also discussed his plans for retirement and strongly encouraged him to keep his mind and body busy.

## 2015-01-27 LAB — HEPATITIS C ANTIBODY: HCV AB: NEGATIVE

## 2016-01-28 ENCOUNTER — Ambulatory Visit (INDEPENDENT_AMBULATORY_CARE_PROVIDER_SITE_OTHER): Payer: 59 | Admitting: Family Medicine

## 2016-01-28 ENCOUNTER — Encounter: Payer: Self-pay | Admitting: Family Medicine

## 2016-01-28 VITALS — BP 138/82 | HR 75 | Ht 72.0 in | Wt 219.2 lb

## 2016-01-28 DIAGNOSIS — Z0000 Encounter for general adult medical examination without abnormal findings: Secondary | ICD-10-CM | POA: Diagnosis not present

## 2016-01-28 DIAGNOSIS — Z87891 Personal history of nicotine dependence: Secondary | ICD-10-CM | POA: Diagnosis not present

## 2016-01-28 DIAGNOSIS — Z114 Encounter for screening for human immunodeficiency virus [HIV]: Secondary | ICD-10-CM | POA: Diagnosis not present

## 2016-01-28 DIAGNOSIS — Z23 Encounter for immunization: Secondary | ICD-10-CM | POA: Diagnosis not present

## 2016-01-28 DIAGNOSIS — E739 Lactose intolerance, unspecified: Secondary | ICD-10-CM | POA: Diagnosis not present

## 2016-01-28 LAB — CBC WITH DIFFERENTIAL/PLATELET
BASOS PCT: 0 %
Basophils Absolute: 0 cells/uL (ref 0–200)
Eosinophils Absolute: 104 cells/uL (ref 15–500)
Eosinophils Relative: 2 %
HEMATOCRIT: 40.2 % (ref 38.5–50.0)
Hemoglobin: 13.1 g/dL — ABNORMAL LOW (ref 13.2–17.1)
LYMPHS PCT: 57 %
Lymphs Abs: 2964 cells/uL (ref 850–3900)
MCH: 28.1 pg (ref 27.0–33.0)
MCHC: 32.6 g/dL (ref 32.0–36.0)
MCV: 86.1 fL (ref 80.0–100.0)
MONO ABS: 468 {cells}/uL (ref 200–950)
MONOS PCT: 9 %
MPV: 10.6 fL (ref 7.5–12.5)
Neutro Abs: 1664 cells/uL (ref 1500–7800)
Neutrophils Relative %: 32 %
PLATELETS: 154 10*3/uL (ref 140–400)
RBC: 4.67 MIL/uL (ref 4.20–5.80)
RDW: 14 % (ref 11.0–15.0)
WBC: 5.2 10*3/uL (ref 4.0–10.5)

## 2016-01-28 LAB — POCT URINALYSIS DIPSTICK
Bilirubin, UA: NEGATIVE
GLUCOSE UA: NEGATIVE
Ketones, UA: NEGATIVE
LEUKOCYTES UA: NEGATIVE
NITRITE UA: NEGATIVE
Protein, UA: NEGATIVE
RBC UA: NEGATIVE
Spec Grav, UA: 1.015
UROBILINOGEN UA: NEGATIVE
pH, UA: 6

## 2016-01-28 LAB — COMPREHENSIVE METABOLIC PANEL
ALK PHOS: 41 U/L (ref 40–115)
ALT: 26 U/L (ref 9–46)
AST: 27 U/L (ref 10–35)
Albumin: 4.4 g/dL (ref 3.6–5.1)
BILIRUBIN TOTAL: 0.5 mg/dL (ref 0.2–1.2)
BUN: 13 mg/dL (ref 7–25)
CO2: 27 mmol/L (ref 20–31)
CREATININE: 1.22 mg/dL (ref 0.70–1.33)
Calcium: 9.2 mg/dL (ref 8.6–10.3)
Chloride: 103 mmol/L (ref 98–110)
GLUCOSE: 98 mg/dL (ref 65–99)
Potassium: 3.8 mmol/L (ref 3.5–5.3)
Sodium: 139 mmol/L (ref 135–146)
Total Protein: 7.2 g/dL (ref 6.1–8.1)

## 2016-01-28 LAB — LIPID PANEL
Cholesterol: 186 mg/dL (ref 125–200)
HDL: 45 mg/dL (ref >=40)
LDL CALC: 128 mg/dL (ref <130)
Total CHOL/HDL Ratio: 4.1 Ratio (ref <=5.0)
Triglycerides: 64 mg/dL (ref <150)
VLDL: 13 mg/dL (ref <30)

## 2016-01-28 NOTE — Progress Notes (Signed)
   Subjective:    Patient ID: Benjamin Macias, male    DOB: 1960-10-24, 55 y.o.   MRN: [id number]  HPI He is here for complete examination. He does have a history of lactose intolerance otherwise no other major issues. He is a former smoker going back several years. He has no particular concerns or complaints. No allergies systems, chest pain, GI issues. Within a couple years of potentially being able to retire. Family and social history as well as health maintenance and immunizations were reviewed.   Review of Systems  All other systems reviewed and are negative.      Objective:   Physical Exam BP 138/82   Pulse 75   Ht 6' (1.829 m)   Wt 219 lb 3.2 oz (99.4 kg)   SpO2 99%   BMI 29.73 kg/m   General Appearance:    Alert, cooperative, no distress, appears stated age  Head:    Normocephalic, without obvious abnormality, atraumatic  Eyes:    PERRL, conjunctiva/corneas clear, EOM's intact, fundi    benign  Ears:    Normal TM's and external ear canals  Nose:   Nares normal, mucosa normal, no drainage or sinus   tenderness  Throat:   Lips, mucosa, and tongue normal; teeth and gums normal  Neck:   Supple, no lymphadenopathy;  thyroid:  no   enlargement/tenderness/nodules; no carotid   bruit or JVD     Lungs:     Clear to auscultation bilaterally without wheezes, rales or     ronchi; respirations unlabored      Heart:    Regular rate and rhythm, S1 and S2 normal, no murmur, rub   or gallop     Abdomen:     Soft, non-tender, nondistended, normoactive bowel sounds,    no masses, no hepatosplenomegaly  Genitalia:    Normal male external genitalia without lesions.  Testicles without masses.  No inguinal hernias.  Rectal:   Deferred   Extremities:   No clubbing, cyanosis or edema  Pulses:   2+ and symmetric all extremities  Skin:   Skin color, texture, turgor normal, no rashes or lesions  Lymph nodes:   Cervical, supraclavicular, and axillary nodes normal  Neurologic:   CNII-XII intact,  normal strength, sensation and gait; reflexes 2+ and symmetric throughout          Psych:   Normal mood, affect, hygiene and grooming.          Assessment & Plan:  Routine general medical examination at a health care facility - Plan: Urinalysis Dipstick, CBC with Differential/Platelet, Comprehensive metabolic panel, HIV antibody, Lipid panel  Need for prophylactic vaccination and inoculation against influenza  Former smoker, stopped smoking in distant past  Screening for HIV (human immunodeficiency virus) - Plan: HIV antibody  Lactose intolerance His last blood work looked great and I therefore did not order it again. Overall he is in excellent health. Discussed the fact that at age 55 we will do AAA screening.

## 2016-01-28 NOTE — Patient Instructions (Signed)
Keep yourself in good physical shape and make sure you do your stretching exercises consider yoga.

## 2016-01-29 LAB — HIV ANTIBODY (ROUTINE TESTING W REFLEX): HIV 1&2 Ab, 4th Generation: NONREACTIVE

## 2016-03-30 DIAGNOSIS — H40013 Open angle with borderline findings, low risk, bilateral: Secondary | ICD-10-CM | POA: Diagnosis not present

## 2016-03-30 DIAGNOSIS — H2513 Age-related nuclear cataract, bilateral: Secondary | ICD-10-CM | POA: Diagnosis not present

## 2016-08-16 ENCOUNTER — Encounter: Payer: Self-pay | Admitting: Family Medicine

## 2016-08-16 ENCOUNTER — Ambulatory Visit (INDEPENDENT_AMBULATORY_CARE_PROVIDER_SITE_OTHER): Payer: 59 | Admitting: Family Medicine

## 2016-08-16 VITALS — BP 150/90 | HR 60 | Temp 97.9°F | Wt 221.0 lb

## 2016-08-16 DIAGNOSIS — R42 Dizziness and giddiness: Secondary | ICD-10-CM | POA: Diagnosis not present

## 2016-08-16 LAB — CBC WITH DIFFERENTIAL/PLATELET
BASOS ABS: 0 {cells}/uL (ref 0–200)
BASOS PCT: 0 %
EOS PCT: 2 %
Eosinophils Absolute: 106 cells/uL (ref 15–500)
HCT: 42.7 % (ref 38.5–50.0)
Hemoglobin: 14 g/dL (ref 13.2–17.1)
LYMPHS PCT: 45 %
Lymphs Abs: 2385 cells/uL (ref 850–3900)
MCH: 28.1 pg (ref 27.0–33.0)
MCHC: 32.8 g/dL (ref 32.0–36.0)
MCV: 85.6 fL (ref 80.0–100.0)
MPV: 10.5 fL (ref 7.5–12.5)
Monocytes Absolute: 318 cells/uL (ref 200–950)
Monocytes Relative: 6 %
NEUTROS PCT: 47 %
Neutro Abs: 2491 cells/uL (ref 1500–7800)
PLATELETS: 171 10*3/uL (ref 140–400)
RBC: 4.99 MIL/uL (ref 4.20–5.80)
RDW: 14.1 % (ref 11.0–15.0)
WBC: 5.3 10*3/uL (ref 4.0–10.5)

## 2016-08-16 LAB — COMPREHENSIVE METABOLIC PANEL
ALK PHOS: 45 U/L (ref 40–115)
ALT: 22 U/L (ref 9–46)
AST: 25 U/L (ref 10–35)
Albumin: 4.6 g/dL (ref 3.6–5.1)
BUN: 11 mg/dL (ref 7–25)
CO2: 24 mmol/L (ref 20–31)
Calcium: 10 mg/dL (ref 8.6–10.3)
Chloride: 104 mmol/L (ref 98–110)
Creat: 1.09 mg/dL (ref 0.70–1.33)
GLUCOSE: 104 mg/dL — AB (ref 65–99)
POTASSIUM: 4.4 mmol/L (ref 3.5–5.3)
Sodium: 139 mmol/L (ref 135–146)
Total Bilirubin: 0.4 mg/dL (ref 0.2–1.2)
Total Protein: 7.2 g/dL (ref 6.1–8.1)

## 2016-08-16 MED ORDER — MECLIZINE HCL 12.5 MG PO TABS: 12.5000 mg | ORAL_TABLET | Freq: Three times a day (TID) | ORAL | 0 refills | Status: DC | PRN

## 2016-08-16 NOTE — Progress Notes (Signed)
Subjective:     Patient ID: Benjamin Macias, male   DOB: [birth year], 56 y.o.   MRN: [id number]  HPI Benjamin Macias is a 56 year old male with a past medical history of lactose intolerance who presents today with a 3 day history of nausea as well as dizziness in the mornings.   Nausea/Dizziness His nausea and dizziness started 3 days ago. He has noted dizziness when he wakes up and gets out of bed and has to sit at the edge of the bed for a few minutes before he can stand up. The dizziness lasts 20-30 seconds and then goes away. When he gets dizzy he notes the room is spinning and he has flushing and sweating. He does not experience this dizziness at all during the day. The nausea is worse when he has the dizziness episode, but continues throughout the day. He notes a 2 day history of increased drowsiness and a 1 day history of scratchy throat and dry mouth. He had a softer than normal bowel movement this morning, but has not had any diarrhea or constipation and no blood in his stool. He hasn't had any vomiting, fevers, chills, itchy/watery eyes, runny nose, sinus pressure, increase in stress, or change in diet or medication. He notes he experienced an episode of vertigo several years ago that was similar to this, but the nausea wasn't as bad. He took meclizine at the time and this helped.   Review of Systems Pertinent positives and negatives included in the subjective above.    Objective:   Physical Exam Alert and in no distress. Ears were normal and tympanic membranes were non-erythematous and non-bulging. Throat was non-erythematous and without exudate. Nasal mucosa was normal. Neck had no lymphadenopathy and was non-tender to palpation. Heart was regular rate and rhythm without murmurs, rubs, or gallops. Lungs were clear to auscultation.     Assessment and Plan:     Vertigo - Plan: CBC with Differential/Platelet, Comprehensive metabolic panel, meclizine (ANTIVERT) 12.5 MG tablet  1. Vertigo:  We recommended the patient take emetrol OTC for nausea as needed. We also prescribed meclizine to see if this helps with the dizziness in the morning. We recommended he take this at night. We will get a CBC and CMP to evaluate for anemia as well as other causes of his symptoms. I agree JL Patient was seen in conjunction with Dr. Susann GivensLalonde. Tessie Fass-Mackenzie Davis, MS3

## 2017-01-31 ENCOUNTER — Encounter: Payer: 59 | Admitting: Family Medicine

## 2017-02-23 ENCOUNTER — Ambulatory Visit (INDEPENDENT_AMBULATORY_CARE_PROVIDER_SITE_OTHER): Payer: 59 | Admitting: Family Medicine

## 2017-02-23 ENCOUNTER — Encounter: Payer: Self-pay | Admitting: Family Medicine

## 2017-02-23 VITALS — BP 130/70 | HR 77 | Resp 16 | Ht 70.0 in | Wt 224.4 lb

## 2017-02-23 DIAGNOSIS — Z87891 Personal history of nicotine dependence: Secondary | ICD-10-CM | POA: Diagnosis not present

## 2017-02-23 DIAGNOSIS — Z0000 Encounter for general adult medical examination without abnormal findings: Secondary | ICD-10-CM | POA: Diagnosis not present

## 2017-02-23 DIAGNOSIS — E739 Lactose intolerance, unspecified: Secondary | ICD-10-CM

## 2017-02-23 DIAGNOSIS — Z23 Encounter for immunization: Secondary | ICD-10-CM | POA: Diagnosis not present

## 2017-02-23 LAB — POCT URINALYSIS DIP (PROADVANTAGE DEVICE)
Bilirubin, UA: NEGATIVE
Glucose, UA: NEGATIVE mg/dL
Ketones, POC UA: NEGATIVE mg/dL
Leukocytes, UA: NEGATIVE
Nitrite, UA: NEGATIVE
PH UA: 6 (ref 5.0–8.0)
PROTEIN UA: NEGATIVE mg/dL
RBC UA: NEGATIVE
SPECIFIC GRAVITY, URINE: 1.015
UUROB: NEGATIVE

## 2017-02-23 NOTE — Progress Notes (Signed)
urinal

## 2017-02-23 NOTE — Progress Notes (Signed)
   Subjective:    Patient ID: Benjamin Macias, male    DOB: June 04, 1960, 56 y.o.   MRN: [id number]  HPI He is here for a complete examination.  He does have a previous history of lactose intolerance but is not having any difficulty with that at the present time.  He also has a history of smoking in the distant past.  He does complain of some difficulty with some back discomfort that goes away very quickly when he is up moving.  He does note some difficulty when lying down.  No numbness tingling or weakness.  He has had no chest pain, shortness of breath, GI or GU issues. Family and social history as well as health maintenance and immunizations was reviewed.  He keeps himself physically active.  His children are now grown. Review of Systems  All other systems reviewed and are negative.      Objective:   Physical Exam BP 130/70   Pulse 77   Resp 16   Ht 5\' 10"  (1.778 m)   Wt 224 lb 6.4 oz (101.8 kg)   SpO2 98%   BMI 32.20 kg/m   General Appearance:    Alert, cooperative, no distress, appears stated age  Head:    Normocephalic, without obvious abnormality, atraumatic  Eyes:    PERRL, conjunctiva/corneas clear, EOM's intact, fundi    benign  Ears:    Normal TM's and external ear canals  Nose:   Nares normal, mucosa normal, no drainage or sinus   tenderness  Throat:   Lips, mucosa, and tongue normal; teeth and gums normal  Neck:   Supple, no lymphadenopathy;  thyroid:  no   enlargement/tenderness/nodules; no carotid   bruit or JVD     Lungs:     Clear to auscultation bilaterally without wheezes, rales or     ronchi; respirations unlabored      Heart:    Regular rate and rhythm, S1 and S2 normal, no murmur, rub   or gallop     Abdomen:     Soft, non-tender, nondistended, normoactive bowel sounds,    no masses, no hepatosplenomegaly  Genitalia:   Deferred  Rectal:   Deferred  Extremities:   No clubbing, cyanosis or edema  Pulses:   2+ and symmetric all extremities  Skin:   Skin color,  texture, turgor normal, no rashes or lesions  Lymph nodes:   Cervical, supraclavicular, and axillary nodes normal  Neurologic:   CNII-XII intact, normal strength, sensation and gait; reflexes 2+ and symmetric throughout          Psych:   Normal mood, affect, hygiene and grooming.           Assessment & Plan:  Routine general medical examination at a health care facility - Plan: POCT Urinalysis DIP (Proadvantage Device), Lipid panel  Need for influenza vaccination - Plan: Flu Vaccine QUAD 6+ mos PF IM (Fluarix Quad PF)  Former smoker, stopped smoking in distant past  Lactose intolerance I discussed the aches and pains that he is having and recommended a good exercise program and range of motion.  Explained that the symptoms are very common as we get older and not necessarily related to arthritis.

## 2017-02-24 LAB — LIPID PANEL
Cholesterol: 211 mg/dL — ABNORMAL HIGH (ref <200)
HDL: 46 mg/dL (ref >40)
LDL Cholesterol (Calc): 148 mg/dL (calc) — ABNORMAL HIGH
NON-HDL CHOLESTEROL (CALC): 165 mg/dL — AB (ref <130)
TRIGLYCERIDES: 70 mg/dL (ref <150)
Total CHOL/HDL Ratio: 4.6 (calc) (ref <5.0)

## 2017-03-31 DIAGNOSIS — H40013 Open angle with borderline findings, low risk, bilateral: Secondary | ICD-10-CM | POA: Diagnosis not present

## 2017-03-31 DIAGNOSIS — H2513 Age-related nuclear cataract, bilateral: Secondary | ICD-10-CM | POA: Diagnosis not present

## 2018-02-27 ENCOUNTER — Encounter: Payer: 59 | Admitting: Family Medicine

## 2018-03-02 ENCOUNTER — Encounter: Payer: Self-pay | Admitting: Family Medicine

## 2018-03-06 ENCOUNTER — Other Ambulatory Visit (INDEPENDENT_AMBULATORY_CARE_PROVIDER_SITE_OTHER): Payer: 59

## 2018-03-06 DIAGNOSIS — Z23 Encounter for immunization: Secondary | ICD-10-CM | POA: Diagnosis not present

## 2018-04-06 DIAGNOSIS — H2513 Age-related nuclear cataract, bilateral: Secondary | ICD-10-CM | POA: Diagnosis not present

## 2018-04-06 DIAGNOSIS — H02422 Myogenic ptosis of left eyelid: Secondary | ICD-10-CM | POA: Diagnosis not present

## 2018-04-06 DIAGNOSIS — H40013 Open angle with borderline findings, low risk, bilateral: Secondary | ICD-10-CM | POA: Diagnosis not present

## 2018-05-10 ENCOUNTER — Encounter: Payer: Self-pay | Admitting: Family Medicine

## 2018-05-10 ENCOUNTER — Ambulatory Visit (INDEPENDENT_AMBULATORY_CARE_PROVIDER_SITE_OTHER): Payer: 59 | Admitting: Family Medicine

## 2018-05-10 ENCOUNTER — Ambulatory Visit
Admission: RE | Admit: 2018-05-10 | Discharge: 2018-05-10 | Disposition: A | Discharge status: Home / Self Care | Payer: Self-pay | Source: Ambulatory Visit | Attending: Family Medicine | Admitting: Family Medicine

## 2018-05-10 VITALS — BP 132/86 | HR 59 | Temp 98.2°F | Ht 70.0 in | Wt 213.4 lb

## 2018-05-10 DIAGNOSIS — G8929 Other chronic pain: Secondary | ICD-10-CM

## 2018-05-10 DIAGNOSIS — Z0000 Encounter for general adult medical examination without abnormal findings: Secondary | ICD-10-CM | POA: Diagnosis not present

## 2018-05-10 DIAGNOSIS — E739 Lactose intolerance, unspecified: Secondary | ICD-10-CM | POA: Diagnosis not present

## 2018-05-10 DIAGNOSIS — M545 Low back pain, unspecified: Secondary | ICD-10-CM

## 2018-05-10 DIAGNOSIS — Z23 Encounter for immunization: Secondary | ICD-10-CM | POA: Diagnosis not present

## 2018-05-10 DIAGNOSIS — R030 Elevated blood-pressure reading, without diagnosis of hypertension: Secondary | ICD-10-CM

## 2018-05-10 LAB — POCT URINALYSIS DIP (PROADVANTAGE DEVICE)
Bilirubin, UA: NEGATIVE
Glucose, UA: NEGATIVE mg/dL
Ketones, POC UA: NEGATIVE mg/dL
LEUKOCYTES UA: NEGATIVE
Nitrite, UA: NEGATIVE
PROTEIN UA: NEGATIVE mg/dL
Specific Gravity, Urine: 1.02
UUROB: 3.5
pH, UA: 6 (ref 5.0–8.0)

## 2018-05-10 NOTE — Progress Notes (Signed)
Subjective:    Patient ID: Benjamin Macias, male    DOB: 14-Jul-1960, 58 y.o.   MRN: [id number]  HPI He is here for complete examination.  He still does have some difficulty with chronic back stiffness.  He has been doing stretching exercises but still has some difficulty.  He states it radiates down his right leg but no numbness, tingling or weakness.  He does have a history of lactose intolerance and does use appropriate measures to keep this under control.  He has no other concerns or complaints.  He is getting ready to retire but does plan to work part-time as well as do some traveling.  Family and social history as well as health maintenance and immunizations was reviewed   Review of Systems  All other systems reviewed and are negative.      Objective:   Physical Exam BP 132/86 (BP Location: Left Arm, Patient Position: Sitting)   Pulse (!) 59   Temp 98.2 F (36.8 C)   Ht 5\' 10"  (1.778 m)   Wt 213 lb 6.4 oz (96.8 kg)   SpO2 95%   BMI 30.62 kg/m   General Appearance:    Alert, cooperative, no distress, appears stated age  Head:    Normocephalic, without obvious abnormality, atraumatic  Eyes:    PERRL, conjunctiva/corneas clear, EOM's intact, fundi    benign  Ears:    Normal TM's and external ear canals  Nose:   Nares normal, mucosa normal, no drainage or sinus   tenderness  Throat:   Lips, mucosa, and tongue normal; teeth and gums normal  Neck:   Supple, no lymphadenopathy;  thyroid:  no   enlargement/tenderness/nodules; no carotid   bruit or JVD     Lungs:     Clear to auscultation bilaterally without wheezes, rales or     ronchi; respirations unlabored      Heart:    Regular rate and rhythm, S1 and S2 normal, no murmur, rub   or gallop     Abdomen:     Soft, non-tender, nondistended, normoactive bowel sounds,    no masses, no hepatosplenomegaly  Genitalia:   Deferred  Rectal:   Deferred  Extremities:   No clubbing, cyanosis or edema  Pulses:   2+ and symmetric all  extremities  Skin:   Skin color, texture, turgor normal, no rashes or lesions  Lymph nodes:   Cervical, supraclavicular, and axillary nodes normal  Neurologic:   CNII-XII intact, normal strength, sensation and gait; reflexes 2+ and symmetric throughout          Psych:   Normal mood, affect, hygiene and grooming.   The urine microscopic showed 0-3 red cells per high-power field.        Assessment & Plan:  Routine general medical examination at a health care facility - Plan: POCT Urinalysis DIP (Proadvantage Device), CBC with Differential/Platelet, Comprehensive metabolic panel, Lipid panel  Lactose intolerance  Chronic low back pain without sciatica, unspecified back pain laterality - Plan: DG Lumbar Spine Complete  Need for Tdap vaccination - Plan: Tdap vaccine greater than or equal to 7yo IM  Elevated blood pressure reading Encouraged him to remain physically active. He is to return here in 2 months for recheck on his blood pressure.  I explained that we change the rules concerning blood pressure elevation. Also discussed his low back pain with him and if the x-ray she is nondiagnostic, I will refer him to physical therapy. Do not feel the need to  further evaluate the hematuria.

## 2018-05-11 LAB — COMPREHENSIVE METABOLIC PANEL
ALBUMIN: 4.8 g/dL (ref 3.8–4.9)
ALK PHOS: 47 IU/L (ref 39–117)
ALT: 22 IU/L (ref 0–44)
AST: 27 IU/L (ref 0–40)
Albumin/Globulin Ratio: 1.8 (ref 1.2–2.2)
BUN / CREAT RATIO: 13 (ref 9–20)
BUN: 16 mg/dL (ref 6–24)
Bilirubin Total: 0.6 mg/dL (ref 0.0–1.2)
CO2: 24 mmol/L (ref 20–29)
CREATININE: 1.25 mg/dL (ref 0.76–1.27)
Calcium: 9.7 mg/dL (ref 8.7–10.2)
Chloride: 100 mmol/L (ref 96–106)
GFR calc Af Amer: 73 mL/min/{1.73_m2} (ref >59)
GFR calc non Af Amer: 63 mL/min/{1.73_m2} (ref >59)
GLUCOSE: 87 mg/dL (ref 65–99)
Globulin, Total: 2.6 g/dL (ref 1.5–4.5)
Potassium: 4.3 mmol/L (ref 3.5–5.2)
Sodium: 140 mmol/L (ref 134–144)
Total Protein: 7.4 g/dL (ref 6.0–8.5)

## 2018-05-11 LAB — CBC WITH DIFFERENTIAL/PLATELET
BASOS ABS: 0 10*3/uL (ref 0.0–0.2)
Basos: 1 %
EOS (ABSOLUTE): 0.1 10*3/uL (ref 0.0–0.4)
Eos: 3 %
HEMOGLOBIN: 14.2 g/dL (ref 13.0–17.7)
Hematocrit: 44.3 % (ref 37.5–51.0)
IMMATURE GRANS (ABS): 0 10*3/uL (ref 0.0–0.1)
Immature Granulocytes: 0 %
LYMPHS ABS: 2.8 10*3/uL (ref 0.7–3.1)
LYMPHS: 50 %
MCH: 27.4 pg (ref 26.6–33.0)
MCHC: 32.1 g/dL (ref 31.5–35.7)
MCV: 86 fL (ref 79–97)
MONOCYTES: 9 %
Monocytes Absolute: 0.5 10*3/uL (ref 0.1–0.9)
Neutrophils Absolute: 2 10*3/uL (ref 1.4–7.0)
Neutrophils: 37 %
Platelets: 181 10*3/uL (ref 150–450)
RBC: 5.18 x10E6/uL (ref 4.14–5.80)
RDW: 12.5 % (ref 11.6–15.4)
WBC: 5.5 10*3/uL (ref 3.4–10.8)

## 2018-05-11 LAB — LIPID PANEL
CHOLESTEROL TOTAL: 193 mg/dL (ref 100–199)
Chol/HDL Ratio: 4.5 ratio (ref 0.0–5.0)
HDL: 43 mg/dL (ref >39)
LDL CALC: 137 mg/dL — AB (ref 0–99)
TRIGLYCERIDES: 63 mg/dL (ref 0–149)
VLDL CHOLESTEROL CAL: 13 mg/dL (ref 5–40)

## 2018-05-14 ENCOUNTER — Other Ambulatory Visit: Payer: Self-pay

## 2018-05-14 DIAGNOSIS — M545 Low back pain, unspecified: Secondary | ICD-10-CM

## 2018-05-14 DIAGNOSIS — G8929 Other chronic pain: Secondary | ICD-10-CM

## 2018-05-16 ENCOUNTER — Other Ambulatory Visit: Payer: Self-pay

## 2018-05-16 DIAGNOSIS — G8929 Other chronic pain: Secondary | ICD-10-CM

## 2018-05-16 DIAGNOSIS — M545 Low back pain, unspecified: Secondary | ICD-10-CM

## 2018-05-29 ENCOUNTER — Other Ambulatory Visit: Payer: Self-pay

## 2018-05-29 ENCOUNTER — Encounter: Payer: Self-pay | Admitting: Physical Therapy

## 2018-05-29 ENCOUNTER — Ambulatory Visit: Payer: 59 | Attending: Family Medicine | Admitting: Physical Therapy

## 2018-05-29 DIAGNOSIS — M5440 Lumbago with sciatica, unspecified side: Secondary | ICD-10-CM | POA: Diagnosis not present

## 2018-05-29 DIAGNOSIS — M25651 Stiffness of right hip, not elsewhere classified: Secondary | ICD-10-CM | POA: Insufficient documentation

## 2018-05-29 DIAGNOSIS — G8929 Other chronic pain: Secondary | ICD-10-CM | POA: Insufficient documentation

## 2018-05-29 NOTE — Therapy (Signed)
[facility] Outpatient Rehabilitation [facility] [address] [place], [place], [address] Phone: [phone]   Fax:  [phone]  Physical Therapy Evaluation  Patient Details  Name: Benjamin Macias MRN: [id number] Date of Birth: 1961/03/21 Referring Provider (PT): Dr. Sharlot Gowda   Encounter Date: 05/29/2018  PT End of Session - 05/29/18 1143    Visit Number  1    Number of Visits  5    Date for PT Re-Evaluation  07/10/18    PT Start Time  1106    PT Stop Time  1145    PT Time Calculation (min)  39 min    Activity Tolerance  Patient tolerated treatment well    Behavior During Therapy  [facility] for tasks assessed/performed       History reviewed. No pertinent past medical history.  Past Surgical History:  Procedure Laterality Date  . COLONOSCOPY  2011  . KNEE ARTHROSCOPY W/ MENISCECTOMY  1994    There were no vitals filed for this visit.   Subjective Assessment - 05/29/18 1110    Subjective  Pt with chronic low back pain.  Denies sensory changes, weakness and red flags.  He has pain with certain activities including getting up from the floor, sleeping on his back. It occasional radiates into his Rt LE including calf.      Limitations  Sitting;Other (comment)    How long can you sit comfortably?  1-2 hours of driving     How long can you stand comfortably?  not limited     How long can you walk comfortably?  not limited     Diagnostic tests  XR     Patient Stated Goals  Patient would like to be able to prevent it from getting worse.     Currently in Pain?  No/denies    Pain Location  Back    Pain Orientation  Right    Pain Descriptors / Indicators  Dull    Pain Type  Chronic pain    Pain Radiating Towards  RT LE     Pain Onset  More than a month ago    Pain Frequency  Intermittent    Aggravating Factors   sitting     Pain Relieving Factors  heating pad    Effect of Pain on Daily Activities  limits sitting     Multiple Pain Sites  No         [facility] PT  Assessment - 05/29/18 0001      Assessment   Medical Diagnosis  Rt sided low back pain     Referring Provider (PT)  Dr. Sharlot Gowda    Onset Date/Surgical Date  --   > 1 yr    Next MD Visit  none     Prior Therapy  Long ago for knee pain       Precautions   Precautions  None      Restrictions   Weight Bearing Restrictions  No      Balance Screen   Has the patient fallen in the past 6 months  No      Home Environment   Living Environment  Private residence    Living Arrangements  Spouse/significant other    Type of Home  House      Prior Function   Level of Independence  Independent    Vocation  Retired    Designer, fashion/clothing   Overall Cognitive Status  Within Functional  Limits for tasks assessed      Observation/Other Assessments   Focus on Therapeutic Outcomes (FOTO)   25%      Sensation   Light Touch  Appears Intact      Squat   Comments  needs cues for form      Posture/Postural Control   Posture/Postural Control  Postural limitations    Postural Limitations  Anterior pelvic tilt      AROM   Overall AROM Comments  WFL in lumbar spine       Strength   Overall Strength Comments  Rt hip flexion 4/5 all else 5/5       Flexibility   Hamstrings  about 75-80 deg LLE, Rt LE about 70 deg     Piriformis  tight R>L      Palpation   Spinal mobility  hypomobile lumbar spine segments     Palpation comment  min to no soreness throughout lumbosacral spine       Transfers   Comments  WNL      Ambulation/Gait   Gait Comments  no deviations         Objective measurements completed on examination: See above findings.     PT Education - 05/29/18 1144    Education Details  HEP, POC, PT, flexibility     Person(s) Educated  Patient    Methods  Explanation;Demonstration;Handout;Verbal cues    Comprehension  Verbalized understanding;Returned demonstration          PT Long Term Goals - 05/29/18 1254      PT LONG TERM GOAL  #1   Title  Pt will improve FOTO score to 21% limited or better     Time  5    Period  Weeks    Status  New    Target Date  07/10/18      PT LONG TERM GOAL #2   Title  Pt will reduce pain to none with ADLs, including getting out of bed and up from the floor.     Time  5    Period  Weeks    Status  New    Target Date  07/10/18      PT LONG TERM GOAL #3   Title  Pt will show good form with squats, lifting and no increase in pain.     Time  6    Period  Weeks    Status  New    Target Date  07/10/18      PT LONG TERM GOAL #4   Title  Pt will transition to the gym without increase in back pain , including light jogging if tolerated.     Time  6    Period  Weeks    Status  New    Target Date  07/10/18             Plan - 05/29/18 1258    Clinical Impression Statement  Pt presents for PT eval with mild yet consistent low back pain with occasional radicular symptoms in Rt LE.  He has decreased ROM in Rt hip (bilateral but R.>L) , mild decrease in Rt hip strength and some tightness in lumbar spine.  He just retired this past week, he aims to get back into the gym and PT will help him accomplish this.  He was given full HEP for hip and trunk flexibility and left without pain today.     Examination-Activity Limitations  Dressing;Sit    Stability/Clinical Decision Making  Stable/Uncomplicated    Clinical Decision Making  Low    Rehab Potential  Excellent    PT Frequency  1x / week    PT Duration  6 weeks    PT Treatment/Interventions  ADLs/Self Care Home Management;Moist Heat;Manual techniques;Patient/family education;Therapeutic exercise;Functional mobility training;Dry needling    PT Next Visit Plan  check stretches, add core/low abdominals , eventual hip hinge /lifting     PT Home Exercise Plan  childs pose, piriformis, hamstring, hip flexor 2 ways (step, off table)     Consulted and Agree with Plan of Care  Patient       Patient will benefit from skilled therapeutic  intervention in order to improve the following deficits and impairments:  Pain, Postural dysfunction, Impaired flexibility, Increased fascial restricitons, Decreased range of motion, Decreased strength, Difficulty walking, Decreased mobility  Visit Diagnosis: Chronic right-sided low back pain with sciatica, sciatica laterality unspecified  Stiffness of right hip, not elsewhere classified     Problem List Patient Active Problem List   Diagnosis Date Noted  . Lactose intolerance 01/19/2012    PAA,JENNIFER 05/29/2018, 1:18 PM  [facility] [address] [place], [place], [address] Phone: [phone]   Fax:  [phone]  Name: Benjamin Macias MRN: [id number] Date of Birth: Nov 26, 1960   Karie Mainland, PT 05/29/18 1:18 PM Phone: [phone] Fax: [phone]

## 2018-06-06 ENCOUNTER — Encounter: Payer: Self-pay | Admitting: Physical Therapy

## 2018-06-06 ENCOUNTER — Other Ambulatory Visit: Payer: Self-pay

## 2018-06-06 ENCOUNTER — Ambulatory Visit: Payer: 59 | Admitting: Physical Therapy

## 2018-06-06 DIAGNOSIS — M25651 Stiffness of right hip, not elsewhere classified: Secondary | ICD-10-CM

## 2018-06-06 DIAGNOSIS — M5440 Lumbago with sciatica, unspecified side: Secondary | ICD-10-CM | POA: Diagnosis not present

## 2018-06-06 DIAGNOSIS — G8929 Other chronic pain: Secondary | ICD-10-CM

## 2018-06-06 NOTE — Therapy (Signed)
[facility] Outpatient Rehabilitation [facility] [address] [place], [place], [address] Phone: [phone]   Fax:  [phone]  Physical Therapy Treatment  Patient Details  Name: Benjamin Macias MRN: [id number] Date of Birth: 16-Apr-1960 Referring Provider (PT): Dr. Sharlot Gowda   Encounter Date: 06/06/2018  PT End of Session - 06/06/18 1228    Visit Number  2    Number of Visits  5    Date for PT Re-Evaluation  07/10/18    PT Start Time  1100    PT Stop Time  1143    PT Time Calculation (min)  43 min    Activity Tolerance  Patient tolerated treatment well    Behavior During Therapy  [facility] for tasks assessed/performed       History reviewed. No pertinent past medical history.  Past Surgical History:  Procedure Laterality Date  . COLONOSCOPY  2011  . KNEE ARTHROSCOPY W/ MENISCECTOMY  1994    There were no vitals filed for this visit.  Subjective Assessment - 06/06/18 1127    Subjective  Patient reports no real change. He has been doing his stretches. He dosent know if any of them are making him better but he is no worse.     Limitations  Sitting;Other (comment)    How long can you sit comfortably?  1-2 hours of driving     How long can you stand comfortably?  not limited     How long can you walk comfortably?  not limited     Diagnostic tests  XR     Patient Stated Goals  Patient would like to be able to prevent it from getting worse.     Currently in Pain?  --   Pain is positional                       [facility] Adult PT Treatment/Exercise - 06/06/18 0001      Lumbar Exercises: Stretches   Piriformis Stretch  3 reps;30 seconds    Piriformis Stretch Limitations  knee to opp shoulder       Lumbar Exercises: Supine   Clam Limitations  1x10 with blue. Patient could feel it is the spot that was sore soe he was moveddown to a green x10     Bent Knee Raise Limitations  2x10 cuing for proper abdominal brace     Bridge Limitations  2x10 min cuoing  for technique      Manual Therapy   Manual Therapy  Soft tissue mobilization;Joint mobilization;Manual Traction    Joint Mobilization  gentle PA glides at 3-4 4-5 no increase or decrease in symptoms. He did report some tightness     Soft tissue mobilization  trigger point release to right glut medius area    Manual Traction  LAD 3x45 seconds each leg             PT Education - 06/06/18 1129    Education Details  reviewed HEP, synptom mangement     Person(s) Educated  Patient    Methods  Demonstration;Tactile cues;Verbal cues;Explanation    Comprehension  Verbalized understanding;Returned demonstration;Verbal cues required;Tactile cues required          PT Long Term Goals - 05/29/18 1254      PT LONG TERM GOAL #1   Title  Pt will improve FOTO score to 21% limited or better     Time  5    Period  Weeks    Status  New  Target Date  07/10/18      PT LONG TERM GOAL #2   Title  Pt will reduce pain to none with ADLs, including getting out of bed and up from the floor.     Time  5    Period  Weeks    Status  New    Target Date  07/10/18      PT LONG TERM GOAL #3   Title  Pt will show good form with squats, lifting and no increase in pain.     Time  6    Period  Weeks    Status  New    Target Date  07/10/18      PT LONG TERM GOAL #4   Title  Pt will transition to the gym without increase in back pain , including light jogging if tolerated.     Time  6    Period  Weeks    Status  New    Target Date  07/10/18            Plan - 06/06/18 1221    Clinical Impression Statement  Patient trolerated trreatment well. He had a trigger point in his glut medius on the right side. Therapy used trigger point release but may use needling the next time to release the spot. He was given a tennis ball for home. He was given vcre strenghtening exercises. He had no increase in pain. He was shown how to advance exercises if needed. Therapy also perfromed LAD to bilateral LE. He  reported a stretch.     Stability/Clinical Decision Making  Stable/Uncomplicated    Rehab Potential  Excellent    PT Frequency  1x / week    PT Duration  6 weeks    PT Treatment/Interventions  ADLs/Self Care Home Management;Moist Heat;Manual techniques;Patient/family education;Therapeutic exercise;Functional mobility training;Dry needling    PT Next Visit Plan  check stretches, add core/low abdominals , eventual hip hinge /lifting     PT Home Exercise Plan  childs pose, piriformis, hamstring, hip flexor 2 ways (step, off table) ; clam shell with Ab; MARCHG WITH ab;     Consulted and Agree with Plan of Care  Patient       Patient will benefit from skilled therapeutic intervention in order to improve the following deficits and impairments:  Pain, Postural dysfunction, Impaired flexibility, Increased fascial restricitons, Decreased range of motion, Decreased strength, Difficulty walking, Decreased mobility  Visit Diagnosis: Chronic right-sided low back pain with sciatica, sciatica laterality unspecified  Stiffness of right hip, not elsewhere classified     Problem List Patient Active Problem List   Diagnosis Date Noted  . Lactose intolerance 01/19/2012    Dessie Coma PT DPT  06/06/2018, 2:34 PM  [facility] [address] [place], [place], [address] Phone: [phone]   Fax:  [phone]  Name: Taron Weingartz MRN: [id number] Date of Birth: 1960/06/02

## 2018-06-13 ENCOUNTER — Encounter: Payer: Self-pay | Admitting: Physical Therapy

## 2018-06-13 ENCOUNTER — Other Ambulatory Visit: Payer: Self-pay

## 2018-06-13 ENCOUNTER — Ambulatory Visit: Payer: 59 | Admitting: Physical Therapy

## 2018-06-13 DIAGNOSIS — G8929 Other chronic pain: Secondary | ICD-10-CM

## 2018-06-13 DIAGNOSIS — M25651 Stiffness of right hip, not elsewhere classified: Secondary | ICD-10-CM

## 2018-06-13 DIAGNOSIS — M5440 Lumbago with sciatica, unspecified side: Principal | ICD-10-CM

## 2018-06-13 NOTE — Therapy (Signed)
[facility] Outpatient Rehabilitation [facility] [address] [place], [place], [address] Phone: [phone]   Fax:  [phone]  Physical Therapy Treatment  Patient Details  Name: Benjamin Macias MRN: [id number] Date of Birth: [birth year] Referring Provider (PT): Dr. Sharlot Gowda   Encounter Date: 06/13/2018  PT End of Session - 06/13/18 1112    Visit Number  3    Number of Visits  5    Date for PT Re-Evaluation  07/10/18    PT Start Time  1103    PT Stop Time  1152    PT Time Calculation (min)  49 min    Activity Tolerance  Patient tolerated treatment well    Behavior During Therapy  [facility] for tasks assessed/performed       History reviewed. No pertinent past medical history.  Past Surgical History:  Procedure Laterality Date  . COLONOSCOPY  2011  . KNEE ARTHROSCOPY W/ MENISCECTOMY  1994    There were no vitals filed for this visit.  Subjective Assessment - 06/13/18 1109    Subjective  Pt reports he feels better compared to last visit. He reports he felt good effects from using the tennis ball. Pt reports his 2/10 pain was abolished following treatment.     Limitations  Sitting;Other (comment)    How long can you sit comfortably?  1-2 hours of driving     Patient Stated Goals  Patient would like to be able to prevent it from getting worse.     Currently in Pain?  Yes    Pain Score  2     Pain Location  Back    Pain Orientation  Right    Pain Descriptors / Indicators  Dull    Pain Type  Chronic pain    Pain Onset  More than a month ago    Pain Frequency  Intermittent    Multiple Pain Sites  No                       [facility] Adult PT Treatment/Exercise - 06/13/18 0001      Lumbar Exercises: Standing   Other Standing Lumbar Exercises  standing extension 10 x each    with exhale at end range.      Lumbar Exercises: Prone   Opposite Arm/Leg Raise  10 reps    Other Prone Lumbar Exercises  Prone on elbows 2 mins x 2, Prone press ups 10 x 3,     PPU with sag ( exhale  at top) for last 2 sets.) HEP      Manual Therapy   Manual Therapy  Soft tissue mobilization    Joint Mobilization  IASTM to R glute med        Trigger Point Dry Needling - 06/13/18 0001    Consent Given?  Yes    Muscles Treated Back/Hip  Gluteus medius    Dry Needling Comments  minimal muscle twitch response noted.     Other Dry Needling  .30x60 needle to glut medius right in 3 spots            PT Education - 06/13/18 1111    Education Details  Educated pt on benefits and side effects of dry needling.  Educated pt in and progressed through [facility] protocol including prone on elbows to press up, to pres up with Sag, and standing extensions. Provided pt with HEP including these ther ex and educated on avoiding forceful forward flexion, sitting up straight, sleeping prone,  and discussed spinal mechanics of disc.  Pt responded well to treatment and pain was abolished following treatment. Pt demonstrated and verbalized understanding of HEP instructions.     Person(s) Educated  Patient    Methods  Demonstration;Explanation    Comprehension  Verbalized understanding;Returned demonstration          PT Long Term Goals - 05/29/18 1254      PT LONG TERM GOAL #1   Title  Pt will improve FOTO score to 21% limited or better     Time  5    Period  Weeks    Status  New    Target Date  07/10/18      PT LONG TERM GOAL #2   Title  Pt will reduce pain to none with ADLs, including getting out of bed and up from the floor.     Time  5    Period  Weeks    Status  New    Target Date  07/10/18      PT LONG TERM GOAL #3   Title  Pt will show good form with squats, lifting and no increase in pain.     Time  6    Period  Weeks    Status  New    Target Date  07/10/18      PT LONG TERM GOAL #4   Title  Pt will transition to the gym without increase in back pain , including light jogging if tolerated.     Time  6    Period  Weeks    Status  New    Target Date   07/10/18            Plan - 06/13/18 1113    Clinical Impression Statement  Pre tx pain rated 2/10. Trigger point in low back addressed with dry needling to R glute med with good relief following.  Educated pt in and progressed through [facility] protocol including prone on elbows to press up, to pres up with Sag, and standing extensions. Provided pt with HEP including these ther ex and educated on avoiding forceful forward flexion, sitting up straight, sleeping prone, and discussed spinal mechanics of disc.  Pt responded well to treatment and pain was abolished following treatment. Pt demonstrated and verbalized understanding of HEP instructions.     Examination-Activity Limitations  Dressing;Sit    Stability/Clinical Decision Making  Stable/Uncomplicated    Clinical Decision Making  Low    Rehab Potential  Excellent    PT Frequency  1x / week    PT Duration  6 weeks    PT Treatment/Interventions  ADLs/Self Care Home Management;Moist Heat;Manual techniques;Patient/family education;Therapeutic exercise;Functional mobility training;Dry needling    PT Next Visit Plan  review [place]-based HEP and effects.  Progress to prone supermans, swimming, supine bridges.     PT Home Exercise Plan  childs pose, piriformis, hamstring, hip flexor 2 ways (step, off table) ; clam shell with Ab; MARCHG WITH ab; Prone on elbows, prone press ups, prone press up with sag, standing extension.     Consulted and Agree with Plan of Care  Patient       Patient will benefit from skilled therapeutic intervention in order to improve the following deficits and impairments:  Pain, Postural dysfunction, Impaired flexibility, Increased fascial restricitons, Decreased range of motion, Decreased strength, Difficulty walking, Decreased mobility  Visit Diagnosis: Chronic right-sided low back pain with sciatica, sciatica laterality unspecified  Stiffness of right hip, not elsewhere classified  Problem List Patient  Active Problem List   Diagnosis Date Noted  . Lactose intolerance 01/19/2012    Dessie Coma 06/13/2018, 12:09 PM  [facility] [address] [place], [place], [address] Phone: [phone]   Fax:  [phone]  Name: Benjamin Macias MRN: [id number] Date of Birth: April 03, 1960

## 2018-06-19 ENCOUNTER — Ambulatory Visit: Payer: 59 | Admitting: Physical Therapy

## 2018-06-26 ENCOUNTER — Ambulatory Visit: Payer: 59 | Admitting: Physical Therapy

## 2018-07-03 ENCOUNTER — Telehealth: Payer: Self-pay | Admitting: Physical Therapy

## 2018-07-03 NOTE — Telephone Encounter (Signed)
Benjamin Macias  was contacted today regarding the temporary reduction of OP Rehab Services due to concerns for community transmission of Covid-19.    Therapist advised the patient to continue to perform their HEP and assured they had no unanswered questions at this time.  The patient was offered over his voicemail. Hie was advised to call back if he would like to set up tele-health.   Outpatient Rehabilitation Services will follow up with this client when we are able to safely resume care at the [facility] in person.   Patient is aware we can be reached by telephone during limited business hours in the meantime.

## 2018-08-06 ENCOUNTER — Ambulatory Visit: Payer: 59 | Admitting: Family Medicine

## 2018-08-06 ENCOUNTER — Other Ambulatory Visit: Payer: Self-pay

## 2018-08-06 ENCOUNTER — Encounter: Payer: Self-pay | Admitting: Family Medicine

## 2018-08-06 VITALS — BP 138/92 | HR 68 | Temp 98.2°F | Wt 225.8 lb

## 2018-08-06 DIAGNOSIS — I10 Essential (primary) hypertension: Secondary | ICD-10-CM

## 2018-08-06 DIAGNOSIS — M545 Low back pain, unspecified: Secondary | ICD-10-CM

## 2018-08-06 DIAGNOSIS — G8929 Other chronic pain: Secondary | ICD-10-CM | POA: Diagnosis not present

## 2018-08-06 MED ORDER — AMLODIPINE BESYLATE 5 MG PO TABS: 5.0000 mg | ORAL_TABLET | Freq: Every day | ORAL | 3 refills | Status: DC

## 2018-08-06 NOTE — Progress Notes (Signed)
   Subjective:    Patient ID: Benjamin Macias, male    DOB: 1960-07-12, 58 y.o.   MRN: [id number]  HPI He is here for a blood pressure recheck.  He has not been checking his blood pressures at home. He also has been through physical therapy for his right-sided low back pain.  Apparently the therapy has not been very beneficial.  He continues on 800 mg of ibuprofen roughly twice per day.   Review of Systems     Objective:   Physical Exam Alert and in no distress.  Blood pressure is recorded.  DTRs normal.  Straight leg raising negative.  Normal sensation.       Assessment & Plan:  Essential hypertension - Plan: amLODipine (NORVASC) 5 MG tablet  Chronic low back pain without sciatica, unspecified back pain laterality - Plan: MR Lumbar Spine Wo Contrast I have decided to treat him with amlodipine for his blood pressure.  He is to return here in 1 month for a recheck on that. I think is also reasonable to see what an MRI will show.

## 2018-08-14 ENCOUNTER — Ambulatory Visit: Payer: 59 | Admitting: Physical Therapy

## 2018-08-15 ENCOUNTER — Other Ambulatory Visit: Payer: Self-pay

## 2018-08-15 ENCOUNTER — Ambulatory Visit
Admission: RE | Admit: 2018-08-15 | Discharge: 2018-08-15 | Disposition: A | Discharge status: Home / Self Care | Payer: 59 | Source: Ambulatory Visit | Attending: Family Medicine | Admitting: Family Medicine

## 2018-08-15 DIAGNOSIS — G8929 Other chronic pain: Secondary | ICD-10-CM

## 2018-08-15 DIAGNOSIS — M545 Low back pain, unspecified: Secondary | ICD-10-CM

## 2018-09-06 ENCOUNTER — Other Ambulatory Visit: Payer: Self-pay

## 2018-09-06 ENCOUNTER — Ambulatory Visit: Payer: 59 | Attending: Family Medicine | Admitting: Physical Therapy

## 2018-09-06 ENCOUNTER — Encounter: Payer: Self-pay | Admitting: Physical Therapy

## 2018-09-06 DIAGNOSIS — G8929 Other chronic pain: Secondary | ICD-10-CM | POA: Diagnosis present

## 2018-09-06 DIAGNOSIS — M5440 Lumbago with sciatica, unspecified side: Secondary | ICD-10-CM | POA: Diagnosis present

## 2018-09-06 DIAGNOSIS — M25651 Stiffness of right hip, not elsewhere classified: Secondary | ICD-10-CM | POA: Diagnosis present

## 2018-09-06 NOTE — Therapy (Signed)
[facility] [address] [place], [place], [address] Phone: [phone]   Fax:  [phone]  Physical Therapy Treatment  Patient Details  Name: Benjamin Macias MRN: [id number] Date of Birth: October 27, 1960 Referring Provider (PT): Dr. Sharlot GowdaJohn Lalonde   Encounter Date: 09/06/2018  PT End of Session - 09/06/18 1321    Visit Number  4    Number of Visits  17    Date for PT Re-Evaluation  10/18/18    PT Start Time  1304    PT Stop Time  1400    PT Time Calculation (min)  56 min    Activity Tolerance  Patient tolerated treatment well    Behavior During Therapy  [facility] for tasks assessed/performed       History reviewed. No pertinent past medical history.  Past Surgical History:  Procedure Laterality Date  . COLONOSCOPY  2011  . KNEE ARTHROSCOPY W/ MENISCECTOMY  1994    There were no vitals filed for this visit.  Subjective Assessment - 09/06/18 1304    Subjective  The pain is back in my Rt side.  Can't drive more than [id number] min. Uses tennis ball.    Currently in Pain?  Yes    Pain Score  --   can be 0/10, driving over here was 9/525/10   Pain Location  Back    Pain Orientation  Right;Lower    Pain Descriptors / Indicators  Aching;Sore;Pressure    Pain Type  Chronic pain    Pain Onset  More than a month ago    Pain Frequency  Intermittent    Aggravating Factors   sitting    Pain Relieving Factors  putting pressure on it    Effect of Pain on Daily Activities  limits comfort at home         [facility] 09/06/18 0001      Assessment   Medical Diagnosis  Rt sided low back pain     Referring Provider (PT)  Dr. Sharlot GowdaJohn Lalonde    Onset Date/Surgical Date  --   > 1 yr    Next MD Visit  none     Prior Therapy  Long ago for knee pain       Precautions   Precautions  None      Restrictions   Weight Bearing Restrictions  No      Observation/Other Assessments   Focus on Therapeutic Outcomes (FOTO)   28%         [facility]  Adult PT Treatment/Exercise - 09/06/18 0001      Self-Care   Other Self-Care Comments   HEP, see education       Lumbar Exercises: Stretches   Active Hamstring Stretch  2 reps;30 seconds    Active Hamstring Stretch Limitations  HEP     Piriformis Stretch  3 reps;30 seconds    Piriformis Stretch Limitations  knee to opp shoulder       Lumbar Exercises: Sidelying   Other Sidelying Lumbar Exercises  Rt sidelying stretch for Rt trunk       Lumbar Exercises: Prone   Other Prone Lumbar Exercises  POE 1 min     Other Prone Lumbar Exercises  Prone press up x 5 for 10 sec encouraged "sag"      Traction   Type of Traction  Lumbar   prone    Min (lbs)  60    Max (lbs)  80    Hold Time  60  Rest Time  15    Time  15             PT Education - 09/06/18 1348    Education Details  PT, HEP review, traction/inversion table, anatomy and MRI report    Person(s) Educated  Patient    Methods  Explanation;Demonstration;Handout    Comprehension  Verbalized understanding          PT Long Term Goals - 09/06/18 1350      PT LONG TERM GOAL #1   Title  Pt will improve FOTO score to 21% limited or better     Time  6    Period  Weeks    Status  New    Target Date  10/18/18      PT LONG TERM GOAL #2   Title  Pt will reduce pain to none with ADLs, including getting out of bed and up from the floor.     Time  6    Period  Weeks    Status  New    Target Date  10/18/18      PT LONG TERM GOAL #3   Title  Pt will show good form with squats, lifting and no increase in pain.     Time  6    Period  Weeks    Status  New    Target Date  10/18/18      PT LONG TERM GOAL #4   Title  Pt will transition to the gym without increase in back pain , including light jogging if tolerated.     Time  6    Period  Weeks    Status  New    Target Date  10/18/18      PT LONG TERM GOAL #5   Title  Pt will be able to sit for 30 min in the car and report min to no pain in low back , thigh    Time   6    Period  Weeks    Status  New    Target Date  10/18/18            Plan - 09/06/18 1350    Clinical Impression Statement  Patient cont to have pain, back to what he was when he first began PT.  We reviewed his MRI and explained L4-L5 level findings of mild foraminal narrowing.  Trial of Mechanical traction .  Reviewed and reissued HEP.    Stability/Clinical Decision Making  Stable/Uncomplicated    Clinical Decision Making  Low    Rehab Potential  Excellent    PT Frequency  2x / week    PT Duration  6 weeks    PT Treatment/Interventions  ADLs/Self Care Home Management;Moist Heat;Manual techniques;Patient/family education;Therapeutic exercise;Functional mobility training;Dry needling;Traction;Ultrasound    PT Next Visit Plan  review McKenzie-based HEP and effects.  Progress to prone supermans, swimming, supine bridges.     PT Home Exercise Plan  piriformis, hamstring, ) ;  Prone on elbows, prone press ups, prone press up with sag, standing extension.    Consulted and Agree with Plan of Care  Patient       Patient will benefit from skilled therapeutic intervention in order to improve the following deficits and impairments:  Pain, Postural dysfunction, Impaired flexibility, Increased fascial restricitons, Decreased range of motion, Decreased strength, Difficulty walking, Decreased mobility       Problem List Patient Active Problem List   Diagnosis Date Noted  . Lactose intolerance  01/19/2012    PAA,JENNIFER 09/06/2018, 2:06 PM  [place], [place], [address] Phone: [phone]   Fax:  [phone]  Name: Benjamin Macias MRN: [id number] Date of Birth: 02-14-1961  [name], PT 09/06/18 2:06 PM Phone: [phone] Fax: [phone]

## 2018-09-18 ENCOUNTER — Other Ambulatory Visit: Payer: Self-pay

## 2018-09-18 ENCOUNTER — Ambulatory Visit: Payer: 59 | Admitting: Physical Therapy

## 2018-09-18 ENCOUNTER — Encounter: Payer: Self-pay | Admitting: Physical Therapy

## 2018-09-18 DIAGNOSIS — M5440 Lumbago with sciatica, unspecified side: Secondary | ICD-10-CM

## 2018-09-18 DIAGNOSIS — M25651 Stiffness of right hip, not elsewhere classified: Secondary | ICD-10-CM

## 2018-09-18 DIAGNOSIS — G8929 Other chronic pain: Secondary | ICD-10-CM

## 2018-09-18 NOTE — Therapy (Signed)
[facility] [address] [place], [place], [address] Phone: [phone]   Fax:  [phone]  Physical Therapy Treatment  Patient Details  Name: Benjamin SaltsDurwood Macias MRN: [id number] Date of Birth: Feb 27, 1961 Referring Provider (PT): Dr. Sharlot GowdaJohn Lalonde   Encounter Date: 09/18/2018  PT End of Session - 09/18/18 1034    Visit Number  5    Number of Visits  17    Date for PT Re-Evaluation  10/18/18    PT Start Time  1032    PT Stop Time  1125    PT Time Calculation (min)  53 min    Activity Tolerance  Patient tolerated treatment well    Behavior During Therapy  [facility] for tasks assessed/performed       History reviewed. No pertinent past medical history.  Past Surgical History:  Procedure Laterality Date  . COLONOSCOPY  2011  . KNEE ARTHROSCOPY W/ MENISCECTOMY  1994    There were no vitals filed for this visit.  Subjective Assessment - 09/18/18 1035    Subjective  "I am doing about the same since the last session, still having pain in the Rt side, I was looking up my symptoms and I think it is piriformis syndrome"    Currently in Pain?  Yes                       [facility] Adult PT Treatment/Exercise - 09/18/18 0001      Lumbar Exercises: Sidelying   Other Sidelying Lumbar Exercises  R piriformis stretching in R sidelying 2 x 15      Lumbar Exercises: Prone   Other Prone Lumbar Exercises  super mans 2 x 15      Traction   Type of Traction  Lumbar    Min (lbs)  65    Max (lbs)  85    Hold Time  60    Rest Time  15    Time  15      Manual Therapy   Manual Therapy  Other (comment)    Manual therapy comments  skilled palpation and monitoring during TPDN    Soft tissue mobilization  IASTM along the piriformis, glute med and QL    Other Manual Therapy  tack and stretch of the piriformis       Trigger Point Dry Needling - 09/18/18 0001    Consent Given?  Yes    Education Handout Provided  Previously provided    Muscles Treated Back/Hip  Piriformis;Quadratus lumborum;Lumbar multifidi    Piriformis Response  Twitch response elicited;Palpable increased muscle length    Lumbar multifidi Response  Twitch response elicited;Palpable increased muscle length   R   Quadratus Lumborum Response  Twitch response elicited;Palpable increased muscle length   R          PT Education - 09/18/18 1113    Education Details  reviewed HEP and discussed anatomy in regard to pt's google search regarding piriformis syndrome. Reviewed TPDN and benefits.    Person(s) Educated  Patient    Methods  Explanation;Verbal cues;Handout    Comprehension  Verbalized understanding;Verbal cues required          PT Long Term Goals - 09/06/18 1350      PT LONG TERM GOAL #1   Title  Pt will improve FOTO score to 21% limited or better     Time  6    Period  Weeks    Status  New  Target Date  10/18/18      PT LONG TERM GOAL #2   Title  Pt will reduce pain to none with ADLs, including getting out of bed and up from the floor.     Time  6    Period  Weeks    Status  New    Target Date  10/18/18      PT LONG TERM GOAL #3   Title  Pt will show good form with squats, lifting and no increase in pain.     Time  6    Period  Weeks    Status  New    Target Date  10/18/18      PT LONG TERM GOAL #4   Title  Pt will transition to the gym without increase in back pain , including light jogging if tolerated.     Time  6    Period  Weeks    Status  New    Target Date  10/18/18      PT LONG TERM GOAL #5   Title  Pt will be able to sit for 30 min in the car and report min to no pain in low back , thigh    Time  6    Period  Weeks    Status  New    Target Date  10/18/18            Plan - 09/18/18 1113    Clinical Impression Statement  pt reports having soreness which per his research online he feels it is more likely piriformis syndrome based on his symptomology. performed TPDN focusing on the piriformis, Quadratus  lumborum, and lumbar multifidus. worked on stretching of the piriformis and continued extension bias updating HEP. continued traction end of session updating max pull by 5#.    PT Treatment/Interventions  ADLs/Self Care Home Management;Moist Heat;Manual techniques;Patient/family education;Therapeutic exercise;Functional mobility training;Dry needling;Traction;Ultrasound    PT Next Visit Plan  review McKenzie-based HEP and effects.  Progress to prone supermans, swimming, supine bridges. response to DN    PT Home Exercise Plan  piriformis, hamstring, ) ;  Prone on elbows, prone press ups, prone press up with sag, standing extension.    Consulted and Agree with Plan of Care  Patient       Patient will benefit from skilled therapeutic intervention in order to improve the following deficits and impairments:  Pain, Postural dysfunction, Impaired flexibility, Increased fascial restricitons, Decreased range of motion, Decreased strength, Difficulty walking, Decreased mobility  Visit Diagnosis: 1. Chronic right-sided low back pain with sciatica, sciatica laterality unspecified   2. Stiffness of right hip, not elsewhere classified        Problem List Patient Active Problem List   Diagnosis Date Noted  . Lactose intolerance 01/19/2012    Starr Lake PT, DPT, LAT, ATC  09/18/18  11:18 AM      [facility] [address] [place], [place], [address] Phone: [phone]   Fax:  [phone]  Name: Benjamin Macias MRN: [id number] Date of Birth: May 20, 1960

## 2018-09-20 ENCOUNTER — Encounter: Payer: Self-pay | Admitting: Physical Therapy

## 2018-09-20 ENCOUNTER — Ambulatory Visit: Payer: 59 | Admitting: Physical Therapy

## 2018-09-20 ENCOUNTER — Other Ambulatory Visit: Payer: Self-pay

## 2018-09-20 DIAGNOSIS — M25651 Stiffness of right hip, not elsewhere classified: Secondary | ICD-10-CM

## 2018-09-20 DIAGNOSIS — M5440 Lumbago with sciatica, unspecified side: Secondary | ICD-10-CM | POA: Diagnosis not present

## 2018-09-20 DIAGNOSIS — G8929 Other chronic pain: Secondary | ICD-10-CM

## 2018-09-20 NOTE — Therapy (Signed)
[facility] [address] [place], [place], [address] Phone: [phone]   Fax:  [phone]  Physical Therapy Treatment  Patient Details  Name: Benjamin Macias MRN: [id number] Date of Birth: 1961-02-20 Referring Provider (PT): Dr. Sharlot GowdaJohn Lalonde   Encounter Date: 09/20/2018  PT End of Session - 09/20/18 1330    Visit Number  6    Number of Visits  17    Date for PT Re-Evaluation  10/18/18    PT Start Time  1330    PT Stop Time  1413    PT Time Calculation (min)  43 min    Activity Tolerance  Patient tolerated treatment well    Behavior During Therapy  [facility] for tasks assessed/performed       History reviewed. No pertinent past medical history.  Past Surgical History:  Procedure Laterality Date  . COLONOSCOPY  2011  . KNEE ARTHROSCOPY W/ MENISCECTOMY  1994    There were no vitals filed for this visit.  Subjective Assessment - 09/20/18 1330    Subjective  "I am not sure if the traction helps, the DN helped. I think I can tell a difference"    Patient Stated Goals  Patient would like to be able to prevent it from getting worse.     Currently in Pain?  Yes    Pain Location  Back    Pain Orientation  Right    Pain Descriptors / Indicators  Aching;Sore;Pressure    Pain Type  Chronic pain                       [facility] Adult PT Treatment/Exercise - 09/20/18 0001      Lumbar Exercises: Stretches   Active Hamstring Stretch  30 seconds;3 reps   PNF contract/ relax   Piriformis Stretch  2 reps;30 seconds   PNF contract/ relax     Lumbar Exercises: Supine   Dead Bug  5 reps   20 second hold   Dead Bug Limitations  verbal cues for proper form and for abdominal tightening    Other Supine Lumbar Exercises  hip external rotation with green band around the feet  2 x 15   to work piriformis     Lumbar Exercises: Sidelying   Clam  15 reps   reverse clamshell  x 2 sets   Hip Abduction  20 reps;1 second;Right      Manual Therapy   Manual therapy comments  skilled palpation and monitoring during TPDN    Joint Mobilization  posterior hip mobs grade III     Other Manual Therapy  tack and stretch of the piriformis       Trigger Point Dry Needling - 09/20/18 0001    Consent Given?  Yes    Education Handout Provided  Previously provided    Muscles Treated Back/Hip  Gluteus minimus;Gluteus medius    Electrical Stimulation Performed with Dry Needling  Yes    E-stim with Dry Needling Details  L (1st solid dot), frequency set to 18, x 10 min    Piriformis Response  Twitch response elicited;Palpable increased muscle length    Lumbar multifidi Response  Twitch response elicited;Palpable increased muscle length    Quadratus Lumborum Response  Twitch response elicited;Palpable increased muscle length                PT Long Term Goals - 09/06/18 1350      PT LONG TERM GOAL #1   Title  Pt will improve FOTO score to 21% limited or better     Time  6    Period  Weeks    Status  New    Target Date  10/18/18      PT LONG TERM GOAL #2   Title  Pt will reduce pain to none with ADLs, including getting out of bed and up from the floor.     Time  6    Period  Weeks    Status  New    Target Date  10/18/18      PT LONG TERM GOAL #3   Title  Pt will show good form with squats, lifting and no increase in pain.     Time  6    Period  Weeks    Status  New    Target Date  10/18/18      PT LONG TERM GOAL #4   Title  Pt will transition to the gym without increase in back pain , including light jogging if tolerated.     Time  6    Period  Weeks    Status  New    Target Date  10/18/18      PT LONG TERM GOAL #5   Title  Pt will be able to sit for 30 min in the car and report min to no pain in low back , thigh    Time  6    Period  Weeks    Status  New    Target Date  10/18/18            Plan - 09/20/18 1405    Clinical Impression Statement  Continued treatment focus on the piriformis which  he does exhibit piriformis syndrome  S&S. worked on piriformis with TPDN. continued IASTM tehcniques and posterior hip mobs. worked on strengthening the hip to promote stability which he did well with. Opted to hold off on traction today due to pt noting limited benefit.    PT Treatment/Interventions  ADLs/Self Care Home Management;Moist Heat;Manual techniques;Patient/family education;Therapeutic exercise;Functional mobility training;Dry needling;Traction;Ultrasound    PT Next Visit Plan  potentially piriformis syndrome,  Progress to prone supermans, swimming, supine bridges. response to DN    PT Home Exercise Plan  piriformis, hamstring, ) ;  Prone on elbows, prone press ups, prone press up with sag, standing extension.    Consulted and Agree with Plan of Care  Patient       Patient will benefit from skilled therapeutic intervention in order to improve the following deficits and impairments:     Visit Diagnosis: 1. Chronic right-sided low back pain with sciatica, sciatica laterality unspecified   2. Stiffness of right hip, not elsewhere classified        Problem List Patient Active Problem List   Diagnosis Date Noted  . Lactose intolerance 01/19/2012   Starr Lake PT, DPT, LAT, ATC  09/20/18  2:15 PM      [facility] [address] [place], [place], [address] Phone: [phone]   Fax:  [phone]  Name: Benjamin Macias MRN: [id number] Date of Birth: 12/23/1960

## 2018-09-25 ENCOUNTER — Other Ambulatory Visit: Payer: Self-pay

## 2018-09-25 ENCOUNTER — Ambulatory Visit: Payer: 59 | Admitting: Physical Therapy

## 2018-09-25 ENCOUNTER — Encounter: Payer: Self-pay | Admitting: Physical Therapy

## 2018-09-25 DIAGNOSIS — M5440 Lumbago with sciatica, unspecified side: Secondary | ICD-10-CM

## 2018-09-25 DIAGNOSIS — M25651 Stiffness of right hip, not elsewhere classified: Secondary | ICD-10-CM

## 2018-09-25 DIAGNOSIS — G8929 Other chronic pain: Secondary | ICD-10-CM

## 2018-09-25 NOTE — Patient Instructions (Signed)
Step 1  Step 2  Supine Posterior Pelvic Tilt reps: 10  sets: 2  hold: 5  daily: 1  weekly: 7 Setup  Begin by lying on your back with your knees bent and feet resting on the floor.  Movement  Slowly bend your low back and tilt your pelvis backward into the floor, then return to the starting position and repeat.  Tip  Make sure to only move your pelvis and low back and keep the rest of your body relaxed. Step 1  Step 2  Pilates Bridge reps: 10  sets: 2  hold: 5  daily: 1  weekly: 7 Setup  Begin lying on your back with both legs bent and your feet resting on the ground. Movement  Take a deep breath in. As you exhale, tighten your abdominals and lift your hips off the ground into a bridge position. Exhale and lower your hips back to the ground, then repeat. Your body should be in a straight line at the top of the movement.  Tip  Make sure your movements are slow and controlled. Do not let your hips rotate to either side as you hold the bridge position. Step 1  Step 2  Clamshell with Resistance reps: 10  sets: 2  hold: 5  daily: 1  weekly: 7 Setup  Begin by lying on your side with your knees bent 90 degrees, hips and shoulders stacked, and a resistance loop secured around your legs.  Movement  Raise your top knee away from the bottom one, then slowly return to the starting position.  Tip  Make sure not to roll your hips forward or backward during the exercise.

## 2018-09-25 NOTE — Therapy (Signed)
[facility] [address] [place], [place], [address] Phone: [phone]   Fax:  [phone]  Physical Therapy Treatment  Patient Details  Name: Benjamin SaltsDurwood Hamblin MRN: [id number] Date of Birth: Apr 01, 1960 Referring Provider (PT): Dr. Sharlot GowdaJohn Lalonde   Encounter Date: 09/25/2018  PT End of Session - 09/25/18 1150    Visit Number  7    Number of Visits  17    Date for PT Re-Evaluation  10/18/18    PT Start Time  1106    PT Stop Time  1200    PT Time Calculation (min)  54 min    Activity Tolerance  Patient tolerated treatment well    Behavior During Therapy  [facility] for tasks assessed/performed       History reviewed. No pertinent past medical history.  Past Surgical History:  Procedure Laterality Date  . COLONOSCOPY  2011  . KNEE ARTHROSCOPY W/ MENISCECTOMY  1994    There were no vitals filed for this visit.  Subjective Assessment - 09/25/18 1107    Subjective  Hurts when I sit.  Driving is hard.  It is better than it was.  Felt some Rt lumbar pain after running last time.  The buttock pain is better.    Currently in Pain?  Yes    Pain Score  4     Pain Location  Back    Pain Orientation  Right    Pain Descriptors / Indicators  Aching    Pain Type  Chronic pain    Pain Onset  More than a month ago    Pain Frequency  Intermittent    Aggravating Factors   sitting    Pain Relieving Factors  stretching, PT , needling        [facility] Adult PT Treatment/Exercise - 09/25/18 0001      Lumbar Exercises: Stretches   Piriformis Stretch  3 reps;30 seconds    Figure 4 Stretch  3 reps;30 seconds    Other Lumbar Stretch Exercise  seated figure 4 stretch x 30 sec x 3       Lumbar Exercises: Aerobic   Elliptical  5 miin , L6 resistance , level 10 ramp       Lumbar Exercises: Supine   Bridge  15 reps    Bridge Limitations  increased time spent on articulation through L spine       Lumbar Exercises: Sidelying   Clam  Both;15 reps    Clam Limitations  blue       Lumbar Exercises: Quadruped   Madcat/Old Horse  5 reps    Madcat/Old Horse Limitations  finding neutral    Other Quadruped Lumbar Exercises  childs pose x 3 with cues       Traction   Min (lbs)  70    Max (lbs)  90    Hold Time  60    Rest Time  15    Time  15             PT Education - 09/25/18 1149    Education Details  spinal articulation for bridging    Person(s) Educated  Patient    Methods  Explanation;Demonstration;Tactile cues;Handout    Comprehension  Verbalized understanding;Returned demonstration          PT Long Term Goals - 09/06/18 1350      PT LONG TERM GOAL #1   Title  Pt will improve FOTO score to 21% limited or better  Time  6    Period  Weeks    Status  New    Target Date  10/18/18      PT LONG TERM GOAL #2   Title  Pt will reduce pain to none with ADLs, including getting out of bed and up from the floor.     Time  6    Period  Weeks    Status  New    Target Date  10/18/18      PT LONG TERM GOAL #3   Title  Pt will show good form with squats, lifting and no increase in pain.     Time  6    Period  Weeks    Status  New    Target Date  10/18/18      PT LONG TERM GOAL #4   Title  Pt will transition to the gym without increase in back pain , including light jogging if tolerated.     Time  6    Period  Weeks    Status  New    Target Date  10/18/18      PT LONG TERM GOAL #5   Title  Pt will be able to sit for 30 min in the car and report min to no pain in low back , thigh    Time  6    Period  Weeks    Status  New    Target Date  10/18/18            Plan - 09/25/18 1116    Clinical Impression Statement  Patient continues to improve, less pain in Rt LE,including buttocks.  Found dry needling to be very beneficial and would like to repeat. Needed increased time to find neutral and find spinal flexion in all positions.  Goals in progress.    PT Treatment/Interventions  ADLs/Self Care Home  Management;Moist Heat;Manual techniques;Patient/family education;Therapeutic exercise;Functional mobility training;Dry needling;Traction;Ultrasound    PT Next Visit Plan  lumbar mobility (quad, bridge) is lacking, awareness of neutral, hip flexor stretching,  Progress to prone supermans (control hyperexension)  swimming, supine bridges. response to DN, repeat traction?    PT Home Exercise Plan  piriformis, hamstring, ) ;  Prone on elbows, prone press ups, prone press up with sag, standing extension, posterior pelvic tilt, artic bridge    Consulted and Agree with Plan of Care  Patient       Patient will benefit from skilled therapeutic intervention in order to improve the following deficits and impairments:  Pain, Postural dysfunction, Impaired flexibility, Increased fascial restricitons, Decreased range of motion, Decreased strength, Difficulty walking, Decreased mobility  Visit Diagnosis: 1. Chronic right-sided low back pain with sciatica, sciatica laterality unspecified   2. Stiffness of right hip, not elsewhere classified        Problem List Patient Active Problem List   Diagnosis Date Noted  . Lactose intolerance 01/19/2012    Alexa Blish 09/25/2018, 12:00 PM  [facility] [address] [name], [place], [address] Phone: [phone]   Fax:  [phone]  Name: Benjamin Macias MRN: [id number] Date of Birth: Feb 18, 1961  [name], PT 09/25/18 12:00 PM Phone: [phone] Fax: [phone]

## 2018-09-27 ENCOUNTER — Ambulatory Visit: Payer: 59 | Attending: Family Medicine | Admitting: Physical Therapy

## 2018-09-27 ENCOUNTER — Other Ambulatory Visit: Payer: Self-pay

## 2018-09-27 DIAGNOSIS — G8929 Other chronic pain: Secondary | ICD-10-CM | POA: Diagnosis present

## 2018-09-27 DIAGNOSIS — M25651 Stiffness of right hip, not elsewhere classified: Secondary | ICD-10-CM | POA: Diagnosis present

## 2018-09-27 DIAGNOSIS — M5440 Lumbago with sciatica, unspecified side: Secondary | ICD-10-CM | POA: Insufficient documentation

## 2018-09-27 NOTE — Therapy (Signed)
[place], [place], [address] Phone: [phone]   Fax:  [phone]  Physical Therapy Treatment  Patient Details  Name: Benjamin Macias MRN: [id number] Date of Birth: Nov 08, 1960 Referring Provider (PT): Dr. Jill Alexanders   Encounter Date: 09/27/2018  PT End of Session - 09/27/18 1221    Visit Number  8    Number of Visits  17    Date for PT Re-Evaluation  10/18/18    PT Start Time  1130    PT Stop Time  1225    PT Time Calculation (min)  55 min    Activity Tolerance  Patient tolerated treatment well    Behavior During Therapy  [facility] for tasks assessed/performed       No past medical history on file.  Past Surgical History:  Procedure Laterality Date  . COLONOSCOPY  2011  . KNEE ARTHROSCOPY W/ MENISCECTOMY  1994    There were no vitals filed for this visit.  Subjective Assessment - 09/27/18 1223    Subjective  Walked 7 miles yesterday.  Feels the same.    Currently in Pain?  Yes    Pain Score  3            [facility] Adult PT Treatment/Exercise - 09/27/18 0001      Lumbar Exercises: Stretches   Hip Flexor Stretch  Right;Left;3 reps;30 seconds    Hip Flexor Stretch Limitations  standing with pelvic tilt (post)       Lumbar Exercises: Aerobic   Elliptical  5 miin , L6 resistance , level 10 ramp       Lumbar Exercises: Prone   Single Arm Raise  10 reps    Straight Leg Raise  10 reps    Opposite Arm/Leg Raise  Right arm/Left leg;Left arm/Right leg;10 reps    Other Prone Lumbar Exercises  Prone press up x 30sec x 3     Other Prone Lumbar Exercises  used green ball       Lumbar Exercises: Quadruped   Other Quadruped Lumbar Exercises  childs pose x 3 with ball    posterior tilt and lateral      Cryotherapy   Number Minutes Cryotherapy  10 Minutes    Cryotherapy Location  Lumbar Spine;Hip    Type of Cryotherapy  Ice pack      Manual Therapy   Soft tissue mobilization  Rt piriformis     Manual Traction  LAD Rt LE 30 sec 3      Other Manual Therapy  tack and stretch of the piriformis          PT Long Term Goals - 09/06/18 1350      PT LONG TERM GOAL #1   Title  Pt will improve FOTO score to 21% limited or better     Time  6    Period  Weeks    Status  New    Target Date  10/18/18      PT LONG TERM GOAL #2   Title  Pt will reduce pain to none with ADLs, including getting out of bed and up from the floor.     Time  6    Period  Weeks    Status  New    Target Date  10/18/18      PT LONG TERM GOAL #3   Title  Pt will show good form with squats, lifting and no increase in pain.     Time  6    Period  Weeks    Status  New    Target Date  10/18/18      PT LONG TERM GOAL #4   Title  Pt will transition to the gym without increase in back pain , including light jogging if tolerated.     Time  6    Period  Weeks    Status  New    Target Date  10/18/18      PT LONG TERM GOAL #5   Title  Pt will be able to sit for 30 min in the car and report min to no pain in low back , thigh    Time  6    Period  Weeks    Status  New    Target Date  10/18/18            Plan - 09/27/18 1130    Clinical Impression Statement  Patient doing well and continues to improve.  Uses stretching, tennis ball and HEP as needed.  Tight hip flexors contributing to his pain.  Able to flex through spine in quadruped better than previous visit.    PT Treatment/Interventions  ADLs/Self Care Home Management;Moist Heat;Manual techniques;Patient/family education;Therapeutic exercise;Functional mobility training;Dry needling;Traction;Ultrasound    PT Next Visit Plan  FOTO lumbar mobility (quad, bridge) is lacking, awareness of neutral, hip flexor stretching,  (control hyperexension)  swimming, supine bridges. response to DN, repeat traction?    PT Home Exercise Plan  piriformis, hamstring, ) ;  Prone on elbows, prone press ups, prone press up with sag, standing extension, posterior pelvic tilt,  artic bridge    Consulted and Agree with Plan of Care  Patient       Patient will benefit from skilled therapeutic intervention in order to improve the following deficits and impairments:  Pain, Postural dysfunction, Impaired flexibility, Increased fascial restricitons, Decreased range of motion, Decreased strength, Difficulty walking, Decreased mobility  Visit Diagnosis: 1. Chronic right-sided low back pain with sciatica, sciatica laterality unspecified   2. Stiffness of right hip, not elsewhere classified        Problem List Patient Active Problem List   Diagnosis Date Noted  . Lactose intolerance 01/19/2012    Ulyses Panico 09/27/2018, 12:25 PM  [facility] [address] [place], [place], [address] Phone: [phone]   Fax:  [phone]  Name: Durel SaltsDurwood Arca MRN: [id number] Date of Birth: [birth year]  Karie MainlandJennifer Basil Blakesley, PT 09/27/18 12:25 PM Phone: [phone] Fax: [phone]

## 2018-10-02 ENCOUNTER — Encounter: Payer: Self-pay | Admitting: Physical Therapy

## 2018-10-02 ENCOUNTER — Other Ambulatory Visit: Payer: Self-pay

## 2018-10-02 ENCOUNTER — Ambulatory Visit: Payer: 59 | Admitting: Physical Therapy

## 2018-10-02 DIAGNOSIS — G8929 Other chronic pain: Secondary | ICD-10-CM

## 2018-10-02 DIAGNOSIS — M5440 Lumbago with sciatica, unspecified side: Secondary | ICD-10-CM

## 2018-10-02 DIAGNOSIS — M25651 Stiffness of right hip, not elsewhere classified: Secondary | ICD-10-CM

## 2018-10-02 NOTE — Therapy (Signed)
[place], [place], [address] Phone: [phone]   Fax:  [phone]  Physical Therapy Treatment  Patient Details  Name: Benjamin Macias MRN: [id number] Date of Birth: 10/27/1960 Referring Provider (PT): Dr. Jill Alexanders   Encounter Date: 10/02/2018  PT End of Session - 10/02/18 1114    Visit Number  9    Number of Visits  17    Date for PT Re-Evaluation  10/18/18    PT Start Time  1105    PT Stop Time  1201    PT Time Calculation (min)  56 min    Activity Tolerance  Patient tolerated treatment well    Behavior During Therapy  [facility] for tasks assessed/performed       History reviewed. No pertinent past medical history.  Past Surgical History:  Procedure Laterality Date  . COLONOSCOPY  2011  . KNEE ARTHROSCOPY W/ MENISCECTOMY  1994    There were no vitals filed for this visit.  Subjective Assessment - 10/02/18 1108    Subjective  Pain is more discomfort in Rt buttocks.  Back doesnt really hurt at all.  Walked 8 miles yesterday AM.    Currently in Pain?  Yes    Pain Score  3     Pain Location  Hip    Pain Orientation  Right    Pain Descriptors / Indicators  Tightness;Sore    Pain Type  Chronic pain    Pain Onset  More than a month ago    Pain Frequency  Intermittent    Aggravating Factors   sitting,    Pain Relieving Factors  stretching, PT          [facility] Adult PT Treatment/Exercise - 10/02/18 0001      Lumbar Exercises: Stretches   Active Hamstring Stretch  3 reps;30 seconds    ITB Stretch  2 reps;60 seconds    Piriformis Stretch  3 reps;30 seconds    Figure 4 Stretch  3 reps;30 seconds      Lumbar Exercises: Aerobic   Nustep  L5 LE only 5 min fast pace       Cryotherapy   Number Minutes Cryotherapy  10 Minutes    Cryotherapy Location  Lumbar Spine;Hip    Type of Cryotherapy  Ice pack      Manual Therapy   Soft tissue mobilization  Rt piriformis     Other Manual Therapy  IASTM Rt  hamstring, gluteals and piriformis                   PT Long Term Goals - 10/02/18 1157      PT LONG TERM GOAL #1   Title  Pt will improve FOTO score to 21% limited or better     Status  Unable to assess      PT LONG TERM GOAL #2   Title  Pt will reduce pain to none with ADLs, including getting out of bed and up from the floor.     Baseline  better    Status  Partially Met      PT LONG TERM GOAL #3   Title  Pt will show good form with squats, lifting and no increase in pain.     Status  On-going      PT LONG TERM GOAL #4   Title  Pt will transition to the gym without increase in back pain , including light jogging if tolerated.  Status  On-going      PT LONG TERM GOAL #5   Title  Pt will be able to sit for 30 min in the car and report min to no pain in low back , thigh    Status  On-going            Plan - 10/02/18 1109    PT Treatment/Interventions  ADLs/Self Care Home Management;Moist Heat;Manual techniques;Patient/family education;Therapeutic exercise;Functional mobility training;Dry needling;Traction;Ultrasound    PT Next Visit Plan  FOTO lumbar mobility (quad, bridge) is lacking, awareness of neutral, hip flexor stretching,  (control hyperexension)  swimming, supine bridges. response to DN, repeat traction?    PT Home Exercise Plan  piriformis, hamstring, ) ;  Prone on elbows, prone press ups, prone press up with sag, standing extension, posterior pelvic tilt, artic bridge       Patient will benefit from skilled therapeutic intervention in order to improve the following deficits and impairments:  Pain, Postural dysfunction, Impaired flexibility, Increased fascial restricitons, Decreased range of motion, Decreased strength, Difficulty walking, Decreased mobility  Visit Diagnosis: 1. Chronic right-sided low back pain with sciatica, sciatica laterality unspecified   2. Stiffness of right hip, not elsewhere classified        Problem List Patient  Active Problem List   Diagnosis Date Noted  . Lactose intolerance 01/19/2012    PAA,JENNIFER 10/02/2018, 12:00 PM  [facility] [address] [place], , [address] Phone: [phone]   Fax:  [phone]  Name: Benjamin Macias MRN: [id number] Date of Birth: 05/22/1960  Jennifer Paa, PT 10/02/18 12:00 PM Phone: [phone] Fax: [phone]  

## 2018-10-04 ENCOUNTER — Ambulatory Visit: Payer: 59 | Admitting: Physical Therapy

## 2018-10-09 ENCOUNTER — Encounter: Payer: Self-pay | Admitting: Physical Therapy

## 2018-10-09 ENCOUNTER — Ambulatory Visit: Payer: 59 | Admitting: Physical Therapy

## 2018-10-09 ENCOUNTER — Other Ambulatory Visit: Payer: Self-pay

## 2018-10-09 DIAGNOSIS — G8929 Other chronic pain: Secondary | ICD-10-CM

## 2018-10-09 DIAGNOSIS — M5440 Lumbago with sciatica, unspecified side: Secondary | ICD-10-CM | POA: Diagnosis not present

## 2018-10-09 DIAGNOSIS — M25651 Stiffness of right hip, not elsewhere classified: Secondary | ICD-10-CM

## 2018-10-09 NOTE — Therapy (Signed)
[place], [place], [address] Phone: [phone]   Fax:  [phone]  Physical Therapy Treatment  Patient Details  Name: Benjamin Macias MRN: [id number] Date of Birth: 11/29/1960 Referring Provider (PT): Dr. Jill Alexanders   Encounter Date: 10/09/2018  PT End of Session - 10/09/18 1136    Visit Number  10    Number of Visits  17    Date for PT Re-Evaluation  10/18/18    PT Start Time  5035    PT Stop Time  1225    PT Time Calculation (min)  49 min    Activity Tolerance  Patient tolerated treatment well    Behavior During Therapy  [facility] for tasks assessed/performed       History reviewed. No pertinent past medical history.  Past Surgical History:  Procedure Laterality Date  . COLONOSCOPY  2011  . KNEE ARTHROSCOPY W/ MENISCECTOMY  1994    There were no vitals filed for this visit.  Subjective Assessment - 10/09/18 1137    Subjective  "I am doing better, I only have issues with sitting"    Currently in Pain?  Yes    Pain Score  4     Pain Location  Hip    Pain Orientation  Right    Pain Type  Chronic pain    Pain Onset  More than a month ago    Pain Frequency  Intermittent    Aggravating Factors   prolonged sitting                       [facility] Adult PT Treatment/Exercise - 10/09/18 0001      Lumbar Exercises: Stretches   Active Hamstring Stretch  3 reps;30 seconds   PNF contract/ relax   ITB Stretch  2 reps;30 seconds;Right    Piriformis Stretch  2 reps;30 seconds      Lumbar Exercises: Supine   Straight Leg Raise  20 reps   with sustained quad set RLE only     Manual Therapy   Manual therapy comments  skilled palpation and monitoring during TPDN    Soft tissue mobilization  Rt piriformis     Manual Traction  LAD Rt LE 30 sec 3      Other Manual Therapy  IASTM Rt hamstring, gluteals and piriformis        Trigger Point Dry Needling - 10/09/18 0001    Consent Given?  Yes     Education Handout Provided  Previously provided    Muscles Treated Lower Quadrant  Hamstring    Muscles Treated Back/Hip  Gluteus medius    E-stim with Dry Needling Details  L (1st solid dot), frequency set to 18, x 8 min   along piriformis   Hamstring Response  Twitch response elicited;Palpable increased muscle length   R proximal   Gluteus Medius Response  Twitch response elicited;Palpable increased muscle length    Piriformis Response  Twitch response elicited;Palpable increased muscle length           PT Education - 10/09/18 1229    Education Details  reviewed hip ER strengthening and added SLR to HEP    Person(s) Educated  Patient    Methods  Explanation;Verbal cues;Handout    Comprehension  Verbalized understanding;Verbal cues required          PT Long Term Goals - 10/02/18 1157      PT LONG TERM GOAL #1   Title  Pt will improve FOTO score to 21% limited or better     Status  Unable to assess      PT LONG TERM GOAL #2   Title  Pt will reduce pain to none with ADLs, including getting out of bed and up from the floor.     Baseline  better    Status  Partially Met      PT LONG TERM GOAL #3   Title  Pt will show good form with squats, lifting and no increase in pain.     Status  On-going      PT LONG TERM GOAL #4   Title  Pt will transition to the gym without increase in back pain , including light jogging if tolerated.     Status  On-going      PT LONG TERM GOAL #5   Title  Pt will be able to sit for 30 min in the car and report min to no pain in low back , thigh    Status  On-going            Plan - 10/09/18 1151    Clinical Impression Statement  pt reports pain stays mostly around 4-5/10 and is worse with sitting. continued TPDN for glue med/ piriformis and adding proximal hamstrings. utilized E-stim for gute med/ piriformis followed with IASTM techniques. focused strengthening of the hip structures to promote stability.He did exhibit mild apparent  LLD on the R moving from short to long following pelivc reset. added SLR to promote pelvic equality.    PT Next Visit Plan  FOTO lumbar mobility (quad, bridge) is lacking, awareness of neutral, hip flexor stretching,  (control hyperexension)  swimming, supine bridges. response to DN, repeat traction?    PT Home Exercise Plan  piriformis, hamstring, ) ;  Prone on elbows, prone press ups, prone press up with sag, standing extension, posterior pelvic tilt, artic bridge, SLR    Consulted and Agree with Plan of Care  Patient       Patient will benefit from skilled therapeutic intervention in order to improve the following deficits and impairments:     Visit Diagnosis: 1. Chronic right-sided low back pain with sciatica, sciatica laterality unspecified   2. Stiffness of right hip, not elsewhere classified        Problem List Patient Active Problem List   Diagnosis Date Noted  . Lactose intolerance 01/19/2012    Starr Lake PT, DPT, LAT, ATC  10/09/18  12:31 PM      [facility] [address] [place], [place], [address] Phone: [phone]   Fax:  [phone]  Name: Benjamin Macias MRN: [id number] Date of Birth: 30-Jan-1961

## 2018-10-11 ENCOUNTER — Ambulatory Visit: Payer: 59 | Admitting: Physical Therapy

## 2018-10-11 ENCOUNTER — Other Ambulatory Visit: Payer: Self-pay

## 2018-10-11 DIAGNOSIS — M25651 Stiffness of right hip, not elsewhere classified: Secondary | ICD-10-CM

## 2018-10-11 DIAGNOSIS — M5440 Lumbago with sciatica, unspecified side: Secondary | ICD-10-CM | POA: Diagnosis not present

## 2018-10-11 DIAGNOSIS — G8929 Other chronic pain: Secondary | ICD-10-CM

## 2018-10-11 NOTE — Therapy (Signed)
[place], [place], [address] Phone: [phone]   Fax:  [phone]  Physical Therapy Treatment  Patient Details  Name: Benjamin Macias MRN: [id number] Date of Birth: December 27, 1960 Referring Provider (PT): Dr. Jill Alexanders   Encounter Date: 10/11/2018  PT End of Session - 10/11/18 1137    Visit Number  11    Number of Visits  17    Date for PT Re-Evaluation  10/18/18    PT Start Time  1135    PT Stop Time  1215    PT Time Calculation (min)  40 min    Activity Tolerance  Patient tolerated treatment well    Behavior During Therapy  [facility] for tasks assessed/performed       No past medical history on file.  Past Surgical History:  Procedure Laterality Date  . COLONOSCOPY  2011  . KNEE ARTHROSCOPY W/ MENISCECTOMY  1994    There were no vitals filed for this visit.  Subjective Assessment - 10/11/18 1234    Subjective  I can lift my leg without pain, Ive been working my hip.    Currently in Pain?  No/denies         [facility] PT Assessment - 10/11/18 0001      Assessment   Medical Diagnosis            Pilates Reformer used for LE/core strength, postural strength, lumbopelvic disassociation and core control.  Exercises included:  Footwork:  2 Red 1 blue , parallel and turnout on heels.  Cues for knee extension and control. Knees track inward without cues  Single legs 2 red 1 blue parallel x 10 each  Single leg in sidelying 1 red 1 blue 1 yellow parallel and hip ER.  Needed increase cues for trunk activation, foot position.   Bridging unable to do with 3 springs [place] springs x 10 with mod to max cues for articulation   Feet in Straps 1 Red stretch for hip flexor and hamstring 60 sec x 2       PT Education - 10/11/18 1235    Education Details  Pilates Reformer, alignment, hip flexor stretching    Person(s) Educated  Patient    Methods  Explanation;Verbal cues;Tactile cues;Handout    Comprehension   Verbalized understanding;Returned demonstration          PT Long Term Goals - 10/11/18 1236      PT LONG TERM GOAL #1   Title  Pt will improve FOTO score to 21% limited or better     Baseline  wait tll DC or renew    Status  Unable to assess      PT LONG TERM GOAL #2   Title  Pt will reduce pain to none with ADLs, including getting out of bed and up from the floor.     Status  Partially Met      PT LONG TERM GOAL #3   Title  Pt will show good form with squats, lifting and no increase in pain.     Status  On-going      PT LONG TERM GOAL #4   Title  Pt will transition to the gym without increase in back pain , including light jogging if tolerated.     Status  On-going      PT LONG TERM GOAL #5   Title  Pt will be able to sit for 30 min in the car and report min to no pain  in low back , thigh    Status  Unable to assess            Plan - 10/11/18 1237    Clinical Impression Statement  Patient cont with pain mostly with sitting.  He shows tight hip flexors and L LE weakness with Pilates Reformer exercises (had L knee surgery).  Progressing but slowly.  Check goals.    PT Treatment/Interventions  ADLs/Self Care Home Management;Moist Heat;Manual techniques;Patient/family education;Therapeutic exercise;Functional mobility training;Dry needling;Traction;Ultrasound    PT Next Visit Plan  FOTO , goals, discuss renew vs DC , lumbar mobility (quad, bridge) is lacking, awareness of neutral, hip flexor stretching,  (control hyperexension)  swimming, supine bridges. response to DN, repeat traction?    PT Home Exercise Plan  piriformis, hamstring, ) ; hip flexor stretching x2 ways,  Prone on elbows, prone press ups, prone press up with sag, standing extension, posterior pelvic tilt, artic bridge, SLR    Consulted and Agree with Plan of Care  Patient       Patient will benefit from skilled therapeutic intervention in order to improve the following deficits and impairments:  Pain,  Postural dysfunction, Impaired flexibility, Increased fascial restricitons, Decreased range of motion, Decreased strength, Difficulty walking, Decreased mobility  Visit Diagnosis: 1. Chronic right-sided low back pain with sciatica, sciatica laterality unspecified   2. Stiffness of right hip, not elsewhere classified        Problem List Patient Active Problem List   Diagnosis Date Noted  . Lactose intolerance 01/19/2012    PAA,JENNIFER 10/11/2018, 12:40 PM  [facility] [place], [place], [address] Phone: [phone]   Fax:  [phone]  Name: Mearle Drew MRN: [id number] Date of Birth: 1960/06/23  [name], PT 10/11/18 12:46 PM Phone: [phone] Fax: [phone]

## 2018-10-16 ENCOUNTER — Other Ambulatory Visit: Payer: Self-pay

## 2018-10-16 ENCOUNTER — Ambulatory Visit: Payer: 59 | Admitting: Physical Therapy

## 2018-10-16 DIAGNOSIS — G8929 Other chronic pain: Secondary | ICD-10-CM

## 2018-10-16 DIAGNOSIS — M5440 Lumbago with sciatica, unspecified side: Secondary | ICD-10-CM

## 2018-10-16 DIAGNOSIS — M25651 Stiffness of right hip, not elsewhere classified: Secondary | ICD-10-CM

## 2018-10-16 NOTE — Therapy (Signed)
[place], [place], [address] Phone: [phone]   Fax:  [phone]  Physical Therapy Treatment  Patient Details  Name: Benjamin Macias MRN: [id number] Date of Birth: 1961-03-27 Referring Provider (PT): Dr. Jill Alexanders   Encounter Date: 10/16/2018  PT End of Session - 10/16/18 1134    Visit Number  12    Number of Visits  17    Date for PT Re-Evaluation  10/18/18    PT Start Time  1133    PT Stop Time  1218    PT Time Calculation (min)  45 min    Activity Tolerance  Patient tolerated treatment well    Behavior During Therapy  [facility] for tasks assessed/performed       No past medical history on file.  Past Surgical History:  Procedure Laterality Date  . COLONOSCOPY  2011  . KNEE ARTHROSCOPY W/ MENISCECTOMY  1994    There were no vitals filed for this visit.  Subjective Assessment - 10/16/18 1136    Subjective  "I think i am getting better, still sore with sitting and driving"    Currently in Pain?  Yes    Pain Score  4     Pain Location  Hip    Pain Orientation  Right    Pain Type  Chronic pain    Pain Onset  More than a month ago    Pain Frequency  Intermittent                       [facility] Adult PT Treatment/Exercise - 10/16/18 0001      Lumbar Exercises: Stretches   Active Hamstring Stretch  3 reps;30 seconds   PNF contract/ relax     Lumbar Exercises: Standing   Forward Lunge  10 reps   walking lunge x 2 sets   Forward Lunge Limitations  required frequent tactile cues for proper form    Other Standing Lumbar Exercises  --    Other Standing Lumbar Exercises  dead lift 1 x 10 45#, 2 x 10 with black theraband   provided as HEP     Manual Therapy   Manual therapy comments  skilled palpation and monitoring during TPDN    Joint Mobilization  R inferior angle sacral mobs grade III    Soft tissue mobilization  IASTM Rt hamstring, gluteals and piriformis     Manual Traction   LAD Rt LE 30 sec 3      Other Manual Therapy  Tack and stretch of the piriformis       Trigger Point Dry Needling - 10/16/18 0001    Consent Given?  Yes    Education Handout Provided  Previously provided    E-stim with Dry Needling Details  L (3rd solid dot), frequency set to 15, x 8 min    Hamstring Response  Twitch response elicited;Palpable increased muscle length    Gluteus Medius Response  Twitch response elicited;Palpable increased muscle length    Piriformis Response  Twitch response elicited;Palpable increased muscle length           PT Education - 10/16/18 1220    Education Details  updated HEP for dead lift and lunges    Person(s) Educated  Patient    Methods  Explanation;Verbal cues;Handout    Comprehension  Verbalized understanding;Verbal cues required          PT Long Term Goals - 10/11/18 1236  PT LONG TERM GOAL #1   Title  Pt will improve FOTO score to 21% limited or better     Baseline  wait tll DC or renew    Status  Unable to assess      PT LONG TERM GOAL #2   Title  Pt will reduce pain to none with ADLs, including getting out of bed and up from the floor.     Status  Partially Met      PT LONG TERM GOAL #3   Title  Pt will show good form with squats, lifting and no increase in pain.     Status  On-going      PT LONG TERM GOAL #4   Title  Pt will transition to the gym without increase in back pain , including light jogging if tolerated.     Status  On-going      PT LONG TERM GOAL #5   Title  Pt will be able to sit for 30 min in the car and report min to no pain in low back , thigh    Status  Unable to assess            Plan - 10/16/18 1220    Clinical Impression Statement  pt reports continued soreness rated at 4/10. continued TPDN along the R pririformis and hamstrings, utilzing DN and E-stim for the piriforms. Focused on strengthening of the posterior kinetic chain which he did well but required demonstration and tactile cues to  perform properly. He noted decreased stiffness end of session.    PT Next Visit Plan  FOTO , R inferior angle sacral mobs, goals, discuss renew vs DC , lumbar mobility (quad, bridge) is lacking, awareness of neutral, hip flexor stretching,  (control hyperexension)  swimming, supine bridges. response to DN, repeat traction?    PT Home Exercise Plan  piriformis, hamstring, ) ; hip flexor stretching x2 ways,  Prone on elbows, prone press ups, prone press up with sag, standing extension, posterior pelvic tilt, artic bridge, SLR    Consulted and Agree with Plan of Care  Patient       Patient will benefit from skilled therapeutic intervention in order to improve the following deficits and impairments:     Visit Diagnosis: 1. Chronic right-sided low back pain with sciatica, sciatica laterality unspecified   2. Stiffness of right hip, not elsewhere classified        Problem List Patient Active Problem List   Diagnosis Date Noted  . Lactose intolerance 01/19/2012   Starr Lake PT, DPT, LAT, ATC  10/16/18  12:25 PM      [facility] [address] [place], [place], [address] Phone: [phone]   Fax:  [phone]  Name: Benjamin Macias MRN: [id number] Date of Birth: Feb 17, 1961

## 2018-10-18 ENCOUNTER — Encounter: Payer: Self-pay | Admitting: Physical Therapy

## 2018-10-18 ENCOUNTER — Other Ambulatory Visit: Payer: Self-pay

## 2018-10-18 ENCOUNTER — Ambulatory Visit: Payer: 59 | Admitting: Physical Therapy

## 2018-10-18 DIAGNOSIS — G8929 Other chronic pain: Secondary | ICD-10-CM

## 2018-10-18 DIAGNOSIS — M5440 Lumbago with sciatica, unspecified side: Secondary | ICD-10-CM | POA: Diagnosis not present

## 2018-10-18 DIAGNOSIS — M25651 Stiffness of right hip, not elsewhere classified: Secondary | ICD-10-CM

## 2018-10-18 NOTE — Patient Instructions (Signed)
AMAZON foam rolller , massage therapist  Core exercises on foam roller

## 2018-10-18 NOTE — Therapy (Signed)
[place], [place], [address] Phone: [phone]   Fax:  [phone]  Physical Therapy Treatment/Discharge   Patient Details  Name: Benjamin Macias MRN: [id number] Date of Birth: 03-Apr-1960 Referring Provider (PT): Dr. Jill Alexanders   Encounter Date: 10/18/2018  PT End of Session - 10/18/18 1349    Visit Number  13    Number of Visits  17    Date for PT Re-Evaluation  10/18/18    PT Start Time  1346    PT Stop Time  1430    PT Time Calculation (min)  44 min    Activity Tolerance  Patient tolerated treatment well    Behavior During Therapy  [facility] for tasks assessed/performed       History reviewed. No pertinent past medical history.  Past Surgical History:  Procedure Laterality Date  . COLONOSCOPY  2011  . KNEE ARTHROSCOPY W/ MENISCECTOMY  1994    There were no vitals filed for this visit.  Subjective Assessment - 10/18/18 1351    Subjective  Its 75% better.  When I walk fast for 4-5 miles I feel it.  Going up a hill.  Took a week off from walking and it was better.  When I bike I dont feel it at all.         [facility] PT Assessment - 10/18/18 0001      Strength   Right Hip ABduction  3+/5    Left Hip ABduction  4+/5        [facility] Adult PT Treatment/Exercise - 10/18/18 0001      Self-Care   Other Self-Care Comments   see education      Lumbar Exercises: Stretches   Hip Flexor Stretch  Right;Left;3 reps;30 seconds    Hip Flexor Stretch Limitations  on foam roller       Lumbar Exercises: Supine   Clam  10 reps    Clam Limitations  foam roller 2 sets     Bent Knee Raise  10 reps    Bent Knee Raise Limitations  on foam roller     Other Supine Lumbar Exercises  UE lift alternating flexion and extension x 10       Manual Therapy   Joint Mobilization  R inferior angle sacral mobs grade III    Soft tissue mobilization  Rt piriformis glutes and hamstring     Other Manual Therapy  Tack and stretch  of the piriformis     Foam rolling techniques for hamstring rolling, piriformis, lateral hip and thigh Instructed on technique and caution    PT Education - 10/18/18 1544    Education Details  see education , DC FOTO    Person(s) Educated  Patient    Methods  Explanation    Comprehension  Verbalized understanding;Returned demonstration          PT Long Term Goals - 10/18/18 1418      PT LONG TERM GOAL #1   Title  Pt will improve FOTO score to 21% limited or better     Baseline  30%    Status  Not Met      PT LONG TERM GOAL #2   Title  Pt will reduce pain to none with ADLs, including getting out of bed and up from the floor.     Status  Achieved      PT LONG TERM GOAL #3   Title  Pt will show good form with squats,  lifting and no increase in pain.     Status  Achieved      PT LONG TERM GOAL #4   Title  Pt will transition to the gym without increase in back pain , including light jogging if tolerated.     Status  Not Met      PT LONG TERM GOAL #5   Title  Pt will be able to sit for 30 min in the car and report min to no pain in low back , thigh    Baseline  15 min pain increases to 6/10    Status  Partially Met            Plan - 10/18/18 1428    Clinical Impression Statement  Patient has met LTGs but cont to have pain in Rt hip, buttocks with sitting.  MFR and foam rolling does help his condition.  Recommend  he reduce his mileage back a bit.  Try foam rolling and see if a deep tissue massage might help his pain .  Resources given.  He may return if pain persists after a period of time.    PT Treatment/Interventions  ADLs/Self Care Home Management;Moist Heat;Manual techniques;Patient/family education;Therapeutic exercise;Functional mobility training;Dry needling;Traction;Ultrasound    PT Next Visit Plan  DC    PT Home Exercise Plan  piriformis, hamstring, ) ; hip flexor stretching x2 ways,  Prone on elbows, prone press ups, prone press up with sag, standing  extension, posterior pelvic tilt, artic bridge, SLR    Consulted and Agree with Plan of Care  Patient       Patient will benefit from skilled therapeutic intervention in order to improve the following deficits and impairments:  Pain, Postural dysfunction, Impaired flexibility, Increased fascial restricitons, Decreased range of motion, Decreased strength, Difficulty walking, Decreased mobility  Visit Diagnosis: 1. Chronic right-sided low back pain with sciatica, sciatica laterality unspecified   2. Stiffness of right hip, not elsewhere classified        Problem List Patient Active Problem List   Diagnosis Date Noted  . Lactose intolerance 01/19/2012    [name] 10/18/2018, 4:01 PM  [place], [place], [address] Phone: [phone]   Fax:  [phone]  Name: Benjamin Macias MRN: [id number] Date of Birth: [birth year]  PHYSICAL THERAPY DISCHARGE SUMMARY  Visits from Start of Care: 13  Current functional level related to goals / functional outcomes: See above    Remaining deficits: Pain and tightness in r hip, hamstring    Education / Equipment: Core, HEP, DN ,posture, stretching, foam rolling Plan: Patient agrees to discharge.  Patient goals were partially met. Patient is being discharged due to being pleased with the current functional level.  ?????    Plateau of progress.   [name], PT 10/18/18 4:03 PM Phone: [phone] Fax: [phone]

## 2019-05-13 ENCOUNTER — Encounter: Payer: 59 | Admitting: Family Medicine

## 2019-05-23 ENCOUNTER — Encounter: Payer: Self-pay | Admitting: Family Medicine

## 2019-05-23 ENCOUNTER — Other Ambulatory Visit: Payer: Self-pay

## 2019-05-23 ENCOUNTER — Ambulatory Visit (INDEPENDENT_AMBULATORY_CARE_PROVIDER_SITE_OTHER): Payer: 59 | Admitting: Family Medicine

## 2019-05-23 VITALS — BP 128/80 | HR 79 | Temp 99.1°F | Ht 70.0 in | Wt 198.0 lb

## 2019-05-23 DIAGNOSIS — Z87891 Personal history of nicotine dependence: Secondary | ICD-10-CM | POA: Diagnosis not present

## 2019-05-23 DIAGNOSIS — I10 Essential (primary) hypertension: Secondary | ICD-10-CM | POA: Diagnosis not present

## 2019-05-23 DIAGNOSIS — Z0000 Encounter for general adult medical examination without abnormal findings: Secondary | ICD-10-CM

## 2019-05-23 DIAGNOSIS — E739 Lactose intolerance, unspecified: Secondary | ICD-10-CM

## 2019-05-23 DIAGNOSIS — Z1211 Encounter for screening for malignant neoplasm of colon: Secondary | ICD-10-CM | POA: Diagnosis not present

## 2019-05-23 LAB — POCT URINALYSIS DIP (PROADVANTAGE DEVICE)
Bilirubin, UA: NEGATIVE
Blood, UA: NEGATIVE
Glucose, UA: NEGATIVE mg/dL
Leukocytes, UA: NEGATIVE
Nitrite, UA: NEGATIVE
Specific Gravity, Urine: 1.025
Urobilinogen, Ur: 0.2
pH, UA: 6 (ref 5.0–8.0)

## 2019-05-23 MED ORDER — AMLODIPINE BESYLATE 5 MG PO TABS: 5.0000 mg | ORAL_TABLET | Freq: Every day | ORAL | 3 refills | Status: DC

## 2019-05-23 NOTE — Progress Notes (Signed)
   Subjective:    Patient ID: Benjamin Macias, male    DOB: 10/19/1960, 59 y.o.   MRN: [id number]  HPI He is here for complete examination.  He is now retired and is now working part-time.  He is on amlodipine for his blood pressure as well as a multivitamin otherwise no medications.  Does have a previous history of smoking.  He exercises regularly.  His home life is going quite well.  Family and social history as well as health maintenance and immunizations was reviewed.  He does have a history of lactose intolerance.   Review of Systems  All other systems reviewed and are negative.      Objective:   Physical Exam Alert and in no distress. Tympanic membranes and canals are normal. Pharyngeal area is normal. Neck is supple without adenopathy or thyromegaly. Cardiac exam shows a regular sinus rhythm without murmurs or gallops. Lungs are clear to auscultation.       Assessment & Plan:  Routine general medical examination at a health care facility - Plan: CBC with Differential/Platelet, Comprehensive metabolic panel, Lipid panel  Screening for colon cancer - Plan: Cologuard  Essential hypertension - Plan: amLODipine (NORVASC) 5 MG tablet  Former smoker  Lactose intolerance I encouraged him to continue to take good care of himself.  Explained that he will qualify for shingles when he turns 59.  Also discussed follow-up when he turns 64 in regard to AAA.

## 2019-05-23 NOTE — Addendum Note (Signed)
Addended by: Renelda Loma on: 05/23/2019 04:50 PM   Modules accepted: Orders

## 2019-05-24 LAB — COMPREHENSIVE METABOLIC PANEL
ALT: 32 IU/L (ref 0–44)
AST: 32 IU/L (ref 0–40)
Albumin/Globulin Ratio: 2 (ref 1.2–2.2)
Albumin: 4.8 g/dL (ref 3.8–4.9)
Alkaline Phosphatase: 48 IU/L (ref 39–117)
BUN/Creatinine Ratio: 12 (ref 9–20)
BUN: 14 mg/dL (ref 6–24)
Bilirubin Total: 0.6 mg/dL (ref 0.0–1.2)
CO2: 20 mmol/L (ref 20–29)
Calcium: 9.6 mg/dL (ref 8.7–10.2)
Chloride: 101 mmol/L (ref 96–106)
Creatinine, Ser: 1.2 mg/dL (ref 0.76–1.27)
GFR calc Af Amer: 76 mL/min/{1.73_m2} (ref >59)
GFR calc non Af Amer: 66 mL/min/{1.73_m2} (ref >59)
Globulin, Total: 2.4 g/dL (ref 1.5–4.5)
Glucose: 72 mg/dL (ref 65–99)
Potassium: 4.1 mmol/L (ref 3.5–5.2)
Sodium: 141 mmol/L (ref 134–144)
Total Protein: 7.2 g/dL (ref 6.0–8.5)

## 2019-05-24 LAB — CBC WITH DIFFERENTIAL/PLATELET
Basophils Absolute: 0 10*3/uL (ref 0.0–0.2)
Basos: 1 %
EOS (ABSOLUTE): 0.1 10*3/uL (ref 0.0–0.4)
Eos: 2 %
Hematocrit: 40.4 % (ref 37.5–51.0)
Hemoglobin: 13.4 g/dL (ref 13.0–17.7)
Immature Grans (Abs): 0 10*3/uL (ref 0.0–0.1)
Immature Granulocytes: 0 %
Lymphocytes Absolute: 2.4 10*3/uL (ref 0.7–3.1)
Lymphs: 49 %
MCH: 28.1 pg (ref 26.6–33.0)
MCHC: 33.2 g/dL (ref 31.5–35.7)
MCV: 85 fL (ref 79–97)
Monocytes Absolute: 0.4 10*3/uL (ref 0.1–0.9)
Monocytes: 9 %
Neutrophils Absolute: 1.9 10*3/uL (ref 1.4–7.0)
Neutrophils: 39 %
Platelets: 168 10*3/uL (ref 150–450)
RBC: 4.77 x10E6/uL (ref 4.14–5.80)
RDW: 12.6 % (ref 11.6–15.4)
WBC: 4.9 10*3/uL (ref 3.4–10.8)

## 2019-05-24 LAB — LIPID PANEL
Chol/HDL Ratio: 3.8 ratio (ref 0.0–5.0)
Cholesterol, Total: 186 mg/dL (ref 100–199)
HDL: 49 mg/dL (ref >39)
LDL Chol Calc (NIH): 126 mg/dL — ABNORMAL HIGH (ref 0–99)
Triglycerides: 59 mg/dL (ref 0–149)
VLDL Cholesterol Cal: 11 mg/dL (ref 5–40)

## 2019-06-06 ENCOUNTER — Encounter: Payer: Self-pay | Admitting: Family Medicine

## 2019-06-16 LAB — COLOGUARD
COLOGUARD: NEGATIVE
Cologuard: NEGATIVE

## 2019-06-25 NOTE — Progress Notes (Signed)
Pt was advise KH 

## 2019-10-28 ENCOUNTER — Encounter: Payer: Self-pay | Admitting: Family Medicine

## 2019-11-11 ENCOUNTER — Other Ambulatory Visit (INDEPENDENT_AMBULATORY_CARE_PROVIDER_SITE_OTHER): Payer: 59

## 2019-11-11 DIAGNOSIS — Z23 Encounter for immunization: Secondary | ICD-10-CM

## 2019-12-09 ENCOUNTER — Other Ambulatory Visit: Payer: Self-pay

## 2019-12-09 ENCOUNTER — Other Ambulatory Visit: Payer: 59

## 2019-12-09 DIAGNOSIS — Z20822 Contact with and (suspected) exposure to covid-19: Secondary | ICD-10-CM

## 2019-12-10 ENCOUNTER — Other Ambulatory Visit: Payer: 59

## 2019-12-11 LAB — NOVEL CORONAVIRUS, NAA: SARS-CoV-2, NAA: NOT DETECTED

## 2019-12-11 LAB — SARS-COV-2, NAA 2 DAY TAT

## 2020-01-07 ENCOUNTER — Ambulatory Visit: Payer: 59 | Attending: Internal Medicine

## 2020-01-07 DIAGNOSIS — Z23 Encounter for immunization: Secondary | ICD-10-CM

## 2020-01-07 NOTE — Progress Notes (Signed)
   Covid-19 Vaccination Clinic  Name:  Damontay Alred    MRN: [id number] DOB: April 04, 1960  01/07/2020  Mr. Mcnab was observed post Covid-19 immunization for 15 minutes without incident. He was provided with Vaccine Information Sheet and instruction to access the V-Safe system.   Mr. Dambrosia was instructed to call 911 with any severe reactions post vaccine: Marland Kitchen Difficulty breathing  . Swelling of face and throat  . A fast heartbeat  . A bad rash all over body  . Dizziness and weakness

## 2020-01-13 ENCOUNTER — Other Ambulatory Visit: Payer: 59

## 2020-01-14 ENCOUNTER — Other Ambulatory Visit (INDEPENDENT_AMBULATORY_CARE_PROVIDER_SITE_OTHER): Payer: 59

## 2020-01-14 ENCOUNTER — Other Ambulatory Visit: Payer: Self-pay

## 2020-01-14 DIAGNOSIS — Z23 Encounter for immunization: Secondary | ICD-10-CM | POA: Diagnosis not present

## 2020-05-29 ENCOUNTER — Encounter: Payer: Self-pay | Admitting: Family Medicine

## 2020-05-29 ENCOUNTER — Ambulatory Visit (INDEPENDENT_AMBULATORY_CARE_PROVIDER_SITE_OTHER): Payer: 59 | Admitting: Family Medicine

## 2020-05-29 ENCOUNTER — Other Ambulatory Visit: Payer: Self-pay

## 2020-05-29 VITALS — BP 130/88 | HR 67 | Temp 96.9°F | Ht 70.0 in | Wt 209.0 lb

## 2020-05-29 DIAGNOSIS — I10 Essential (primary) hypertension: Secondary | ICD-10-CM | POA: Diagnosis not present

## 2020-05-29 DIAGNOSIS — Z0000 Encounter for general adult medical examination without abnormal findings: Secondary | ICD-10-CM

## 2020-05-29 DIAGNOSIS — G8929 Other chronic pain: Secondary | ICD-10-CM

## 2020-05-29 DIAGNOSIS — M545 Low back pain, unspecified: Secondary | ICD-10-CM

## 2020-05-29 LAB — LIPID PANEL
Chol/HDL Ratio: 4.3 ratio (ref 0.0–5.0)
Cholesterol, Total: 209 mg/dL — ABNORMAL HIGH (ref 100–199)
HDL: 49 mg/dL (ref >39)
LDL Chol Calc (NIH): 148 mg/dL — ABNORMAL HIGH (ref 0–99)
Triglycerides: 66 mg/dL (ref 0–149)
VLDL Cholesterol Cal: 12 mg/dL (ref 5–40)

## 2020-05-29 LAB — COMPREHENSIVE METABOLIC PANEL
ALT: 30 IU/L (ref 0–44)
AST: 36 IU/L (ref 0–40)
Albumin/Globulin Ratio: 1.8 (ref 1.2–2.2)
Albumin: 4.6 g/dL (ref 3.8–4.9)
Alkaline Phosphatase: 52 IU/L (ref 44–121)
BUN/Creatinine Ratio: 12 (ref 10–24)
BUN: 14 mg/dL (ref 8–27)
Bilirubin Total: 0.6 mg/dL (ref 0.0–1.2)
CO2: 23 mmol/L (ref 20–29)
Calcium: 9.7 mg/dL (ref 8.6–10.2)
Chloride: 102 mmol/L (ref 96–106)
Creatinine, Ser: 1.13 mg/dL (ref 0.76–1.27)
Globulin, Total: 2.6 g/dL (ref 1.5–4.5)
Glucose: 100 mg/dL — ABNORMAL HIGH (ref 65–99)
Potassium: 4.6 mmol/L (ref 3.5–5.2)
Sodium: 139 mmol/L (ref 134–144)
Total Protein: 7.2 g/dL (ref 6.0–8.5)
eGFR: 74 mL/min/{1.73_m2} (ref >59)

## 2020-05-29 LAB — POCT URINALYSIS DIP (PROADVANTAGE DEVICE)
Bilirubin, UA: NEGATIVE
Blood, UA: NEGATIVE
Glucose, UA: NEGATIVE mg/dL
Ketones, POC UA: NEGATIVE mg/dL
Leukocytes, UA: NEGATIVE
Nitrite, UA: NEGATIVE
Protein Ur, POC: NEGATIVE mg/dL
Specific Gravity, Urine: 1.02
Urobilinogen, Ur: 0.2
pH, UA: 6 (ref 5.0–8.0)

## 2020-05-29 LAB — CBC WITH DIFFERENTIAL/PLATELET
Basophils Absolute: 0.1 10*3/uL (ref 0.0–0.2)
Basos: 1 %
EOS (ABSOLUTE): 0.2 10*3/uL (ref 0.0–0.4)
Eos: 3 %
Hematocrit: 43.8 % (ref 37.5–51.0)
Hemoglobin: 14.1 g/dL (ref 13.0–17.7)
Immature Grans (Abs): 0 10*3/uL (ref 0.0–0.1)
Immature Granulocytes: 0 %
Lymphocytes Absolute: 2.8 10*3/uL (ref 0.7–3.1)
Lymphs: 49 %
MCH: 27.4 pg (ref 26.6–33.0)
MCHC: 32.2 g/dL (ref 31.5–35.7)
MCV: 85 fL (ref 79–97)
Monocytes Absolute: 0.5 10*3/uL (ref 0.1–0.9)
Monocytes: 9 %
Neutrophils Absolute: 2.2 10*3/uL (ref 1.4–7.0)
Neutrophils: 38 %
Platelets: 158 10*3/uL (ref 150–450)
RBC: 5.14 x10E6/uL (ref 4.14–5.80)
RDW: 13 % (ref 11.6–15.4)
WBC: 5.7 10*3/uL (ref 3.4–10.8)

## 2020-05-29 MED ORDER — AMLODIPINE BESYLATE 5 MG PO TABS: 5.0000 mg | ORAL_TABLET | Freq: Every day | ORAL | 3 refills | Status: DC

## 2020-05-29 NOTE — Progress Notes (Signed)
   Subjective:    Patient ID: Benjamin Macias, male    DOB: Dec 21, 1960, 60 y.o.   MRN: [id number]  HPI He is here for complete examination.  He continues to have some difficulty with low back pain.  He has had previous MRI which was essentially negative.  He has been doing back rehab exercises which keeps this under control.  It is not interfering with his ADLs.  He continues on amlodipine and having no difficulty with that.  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  Home life is going well.  He exercises regularly . he has no other concerns or questions.  Family and social history as well as health maintenance and immunizations was reviewed.   Review of Systems  All other systems reviewed and are negative.      Objective:   Physical Exam Alert and in no distress. Tympanic membranes and canals are normal. Pharyngeal area is normal. Neck is supple without adenopathy or thyromegaly. Cardiac exam shows a regular sinus rhythm without murmurs or gallops. Lungs are clear to auscultation.  Abdominal exam shows no masses or tenderness with normal bowel sounds.       Assessment & Plan:  Routine general medical examination at a health care facility - Plan: POCT Urinalysis DIP (Proadvantage Device), CBC with Differential/Platelet, Comprehensive metabolic panel, Lipid panel  Essential hypertension - Plan: POCT Urinalysis DIP (Proadvantage Device), CBC with Differential/Platelet, Comprehensive metabolic panel, amLODipine (NORVASC) 5 MG tablet  Chronic low back pain without sciatica, unspecified back pain laterality Continue on present medication regimen.  Encouraged him to continue with back rehab programs and keep himself physically active.

## 2020-06-09 ENCOUNTER — Telehealth: Payer: Self-pay | Admitting: Family Medicine

## 2020-06-09 NOTE — Telephone Encounter (Signed)
Pt called and said his Amlodipine was sent to [facility] instead of [facility] on [address]. He said it was fine but he was wondering why it was sent there. Pt stated he will call his insurance company in the mean time.

## 2020-06-13 ENCOUNTER — Other Ambulatory Visit: Payer: Self-pay | Admitting: Family Medicine

## 2020-06-13 DIAGNOSIS — I10 Essential (primary) hypertension: Secondary | ICD-10-CM

## 2020-10-09 ENCOUNTER — Telehealth: Payer: Self-pay

## 2020-10-09 NOTE — Telephone Encounter (Signed)
pt. Called stating he just had a bowel movement and noticed some blood in his stool. He has never had this issue before, he said he feels fine just wanted to know if he should be worried or needs to do anything.

## 2020-12-09 ENCOUNTER — Encounter: Payer: Self-pay | Admitting: Family Medicine

## 2020-12-19 IMAGING — MR MRI LUMBAR SPINE WITHOUT CONTRAST
5 series · 45 of 48 positions shown · non-contrast
Comparison: Lumbar spine radiographs 05/10/2018.

CLINICAL DATA: Low right-sided back pain extending into the
buttocks and legs for 1.5 years. No acute injury or prior relevant
surgery.

EXAM:
MRI LUMBAR SPINE WITHOUT CONTRAST
TECHNIQUE: Multiplanar, multisequence MR imaging of the lumbar spine was
performed. No intravenous contrast was administered.

[Series 3: tirm sag · sagittal · 4.0mm · 0.55mm/px · 6 of 13 slices shown]
[im 1/13]
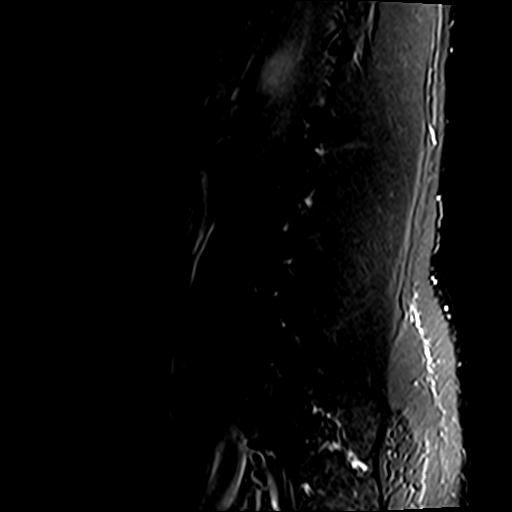
[im 3/13]
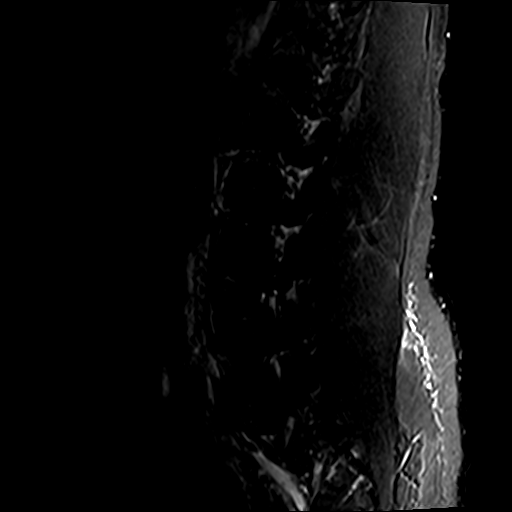
[im 5/13]
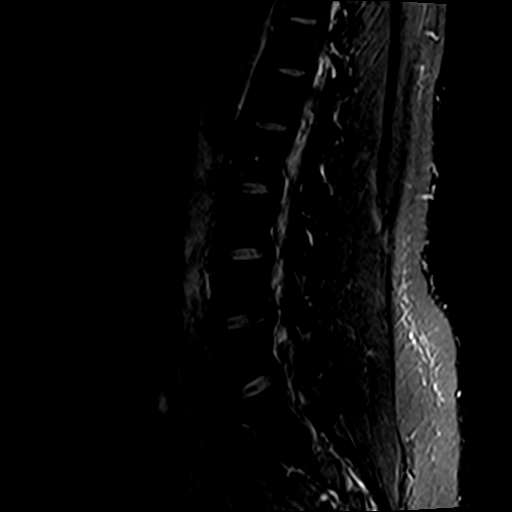
[im 8/13]
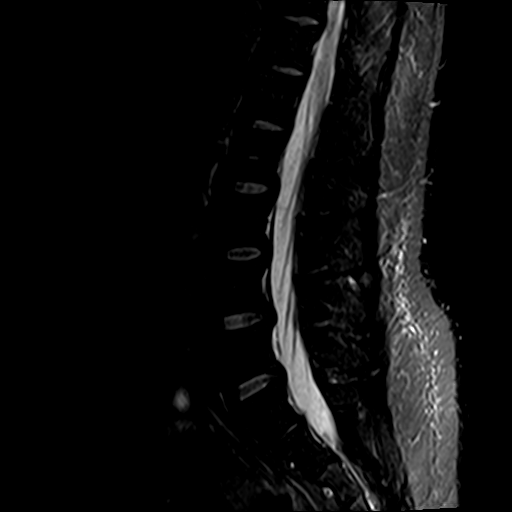
[im 10/13]
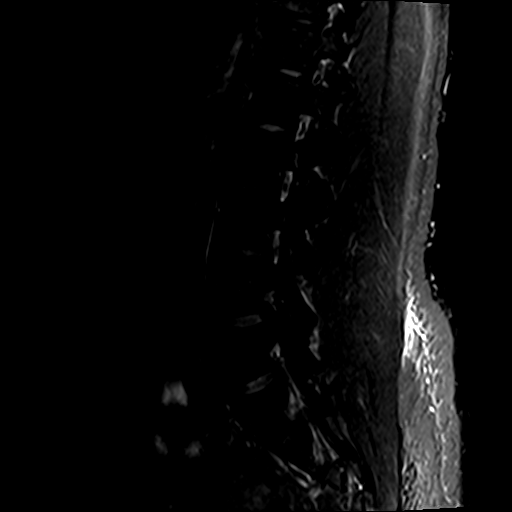
[im 13/13]
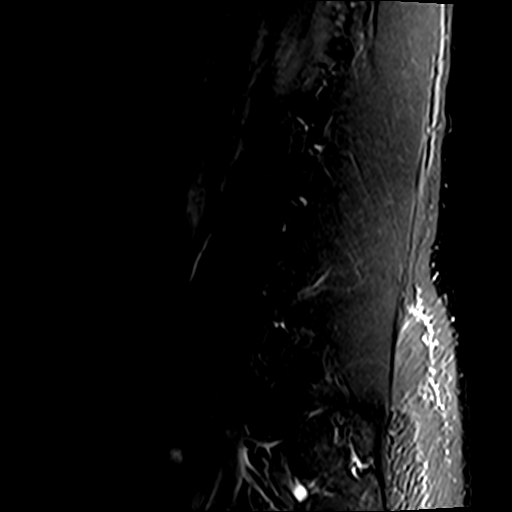

[Series 4: T2 · sagittal · 4.0mm · 0.88mm/px · 5 of 13 slices shown (1 of 2)]
[im 1/13]
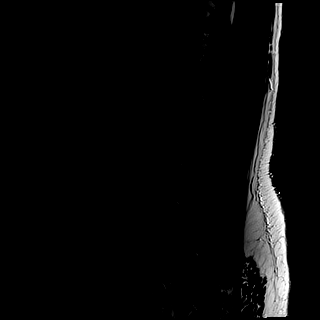
[im 4/13]
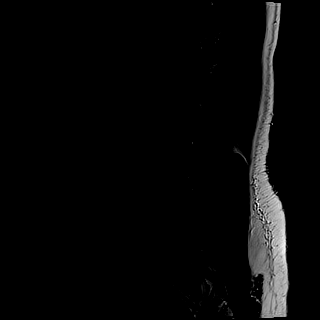
[im 7/13]
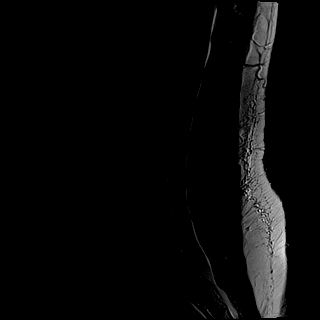
[im 10/13]
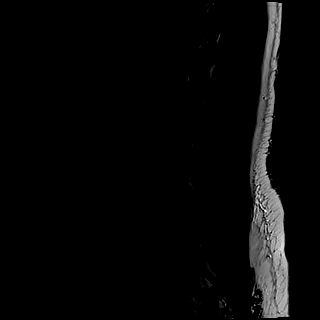
[im 13/13]
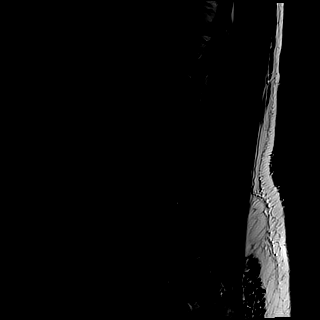

[Series 5: T1 · sagittal · 4.0mm · 0.88mm/px · 5 of 13 slices shown (1 of 2)]
[im 1/13]
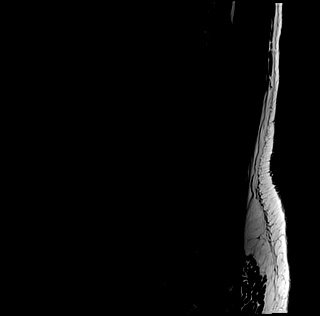
[im 4/13]
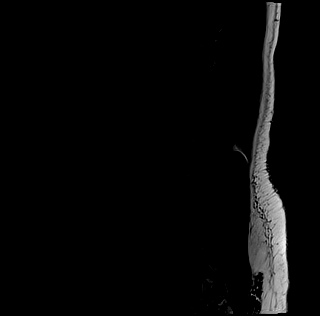
[im 7/13]
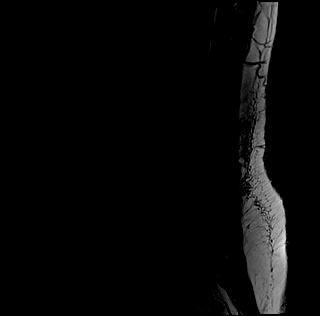
[im 10/13]
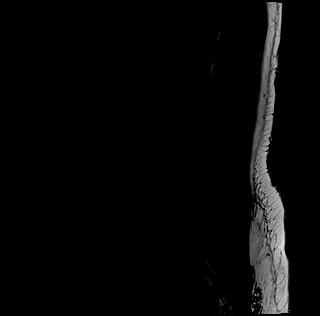
[im 13/13]
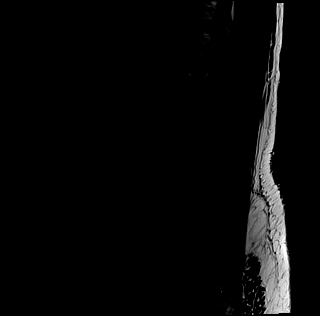

[Series 6: T1 · axial · 4.0mm · 0.78mm/px · z∈[-91,+122]mm · 13 of 38 slices shown (2 of 2)]
[im 1/38]
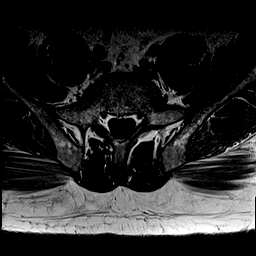
[im 3/38]
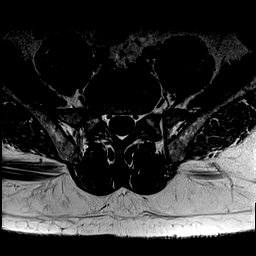
[im 5/38]
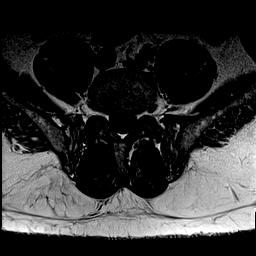
[im 8/38]
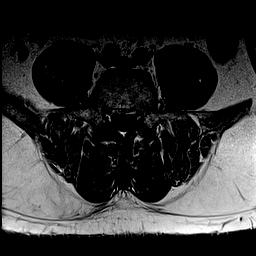
[im 10/38]
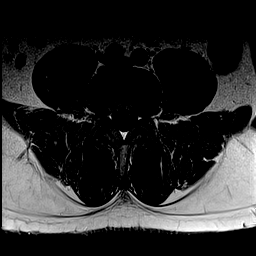
[im 13/38]
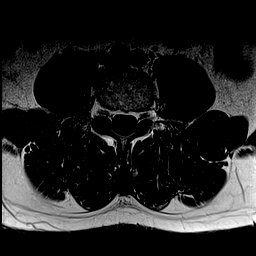
[im 15/38]
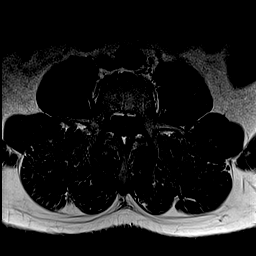
[im 18/38]
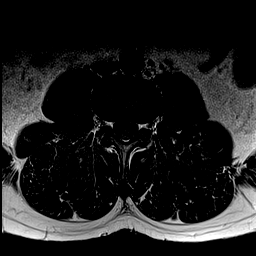
[im 20/38]
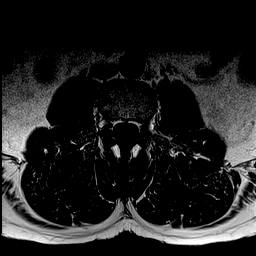
[im 23/38]
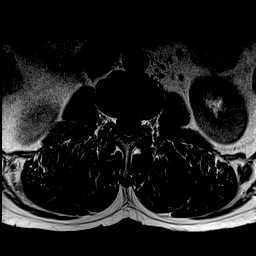
[im 28/38]
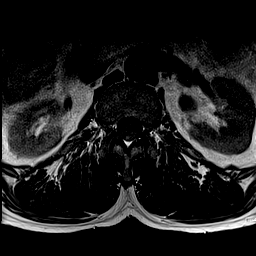
[im 33/38]
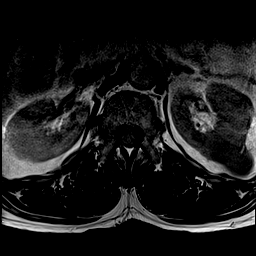
[im 38/38]
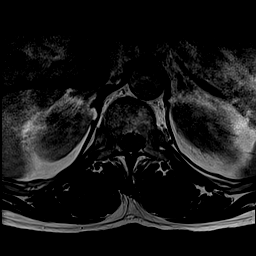

[Series 7: T2 · axial · 4.0mm · 0.78mm/px · z∈[-91,+122]mm · 16 of 38 slices shown (2 of 2)]
[im 1/38]
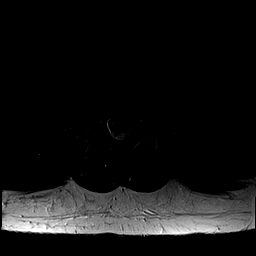
[im 3/38]
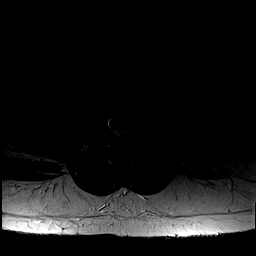
[im 5/38]
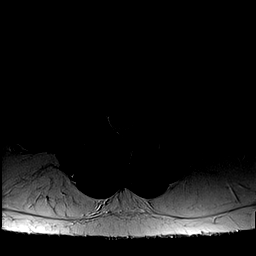
[im 8/38]
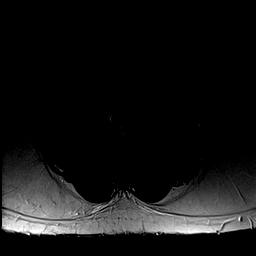
[im 10/38]
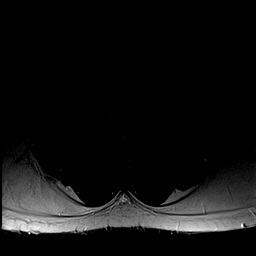
[im 13/38]
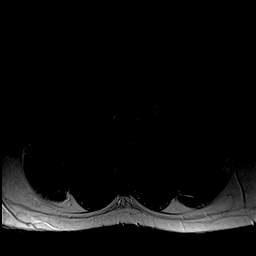
[im 15/38]
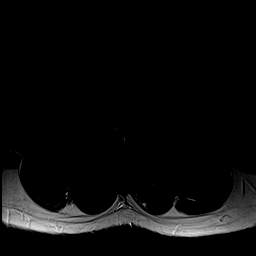
[im 18/38]
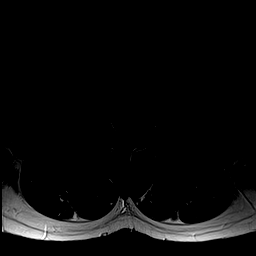
[im 20/38]
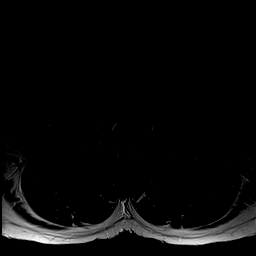
[im 23/38]
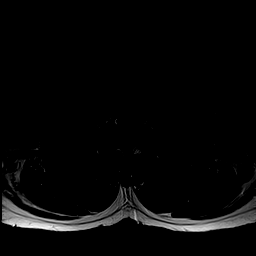
[im 25/38]
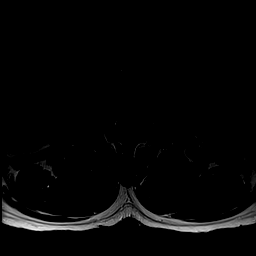
[im 28/38]
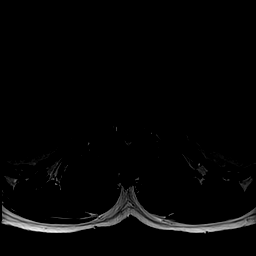
[im 30/38]
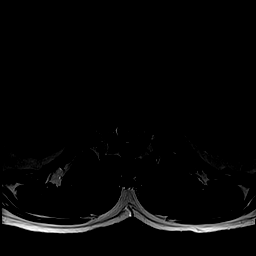
[im 33/38]
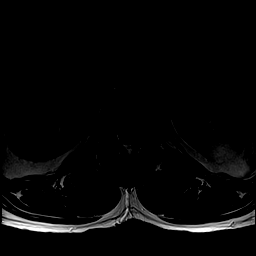
[im 35/38]
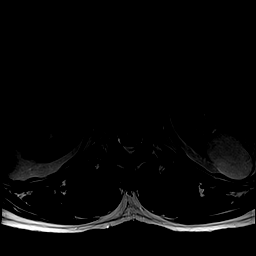
[im 38/38]
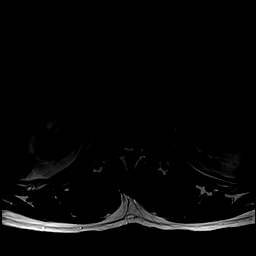

[45 of 48 positions shown; findings below may reference images not displayed]

FINDINGS: Segmentation: Transitional lumbosacral anatomy. In keeping with the
numbering applied to the prior radiographs, the last open disc space
is assigned L5-S1. Using this numbering, there are bilateral ribs at
L1.

Alignment:  Normal.

Vertebrae: No worrisome osseous lesion, acute fracture or pars
defect. The visualized sacroiliac joints appear unremarkable.

Conus medullaris: Extends to the L1-2 level and appears normal.

Paraspinal and other soft tissues: No significant paraspinal
findings. There are bilateral renal cysts.

Disc levels:

No significant disc space findings from T12-L1 through L3-4.

L4-5: Mild loss of disc height with annular disc bulging and mild
facet/ligamentous hypertrophy. Resulting mild narrowing of the
lateral recesses and foramina bilaterally without definite nerve
root encroachment.

L5-S1: Normal interspace.
IMPRESSION: 1. Transitional lumbosacral anatomy. The last open disc space is
assigned L5-S1. By this numbering, there are bilateral ribs at L1.
2. Annular disc bulging, facet and ligamentous hypertrophy at L4-5
contributing to mild lateral recess and foraminal narrowing
bilaterally. No definite nerve root encroachment.
3. No other significant disc space findings.

## 2021-01-04 ENCOUNTER — Encounter: Payer: Self-pay | Admitting: Family Medicine

## 2021-01-26 ENCOUNTER — Other Ambulatory Visit (INDEPENDENT_AMBULATORY_CARE_PROVIDER_SITE_OTHER): Payer: 59

## 2021-01-26 ENCOUNTER — Other Ambulatory Visit: Payer: Self-pay

## 2021-01-26 DIAGNOSIS — Z23 Encounter for immunization: Secondary | ICD-10-CM

## 2021-05-10 ENCOUNTER — Other Ambulatory Visit: Payer: Self-pay | Admitting: Family Medicine

## 2021-05-10 DIAGNOSIS — I10 Essential (primary) hypertension: Secondary | ICD-10-CM

## 2021-06-04 ENCOUNTER — Encounter: Payer: 59 | Admitting: Family Medicine

## 2021-06-07 ENCOUNTER — Ambulatory Visit (INDEPENDENT_AMBULATORY_CARE_PROVIDER_SITE_OTHER): Payer: 59 | Admitting: Family Medicine

## 2021-06-07 ENCOUNTER — Encounter: Payer: Self-pay | Admitting: Family Medicine

## 2021-06-07 ENCOUNTER — Other Ambulatory Visit: Payer: Self-pay

## 2021-06-07 VITALS — BP 122/68 | HR 91 | Temp 97.0°F | Ht 70.0 in | Wt 197.2 lb

## 2021-06-07 DIAGNOSIS — H02402 Unspecified ptosis of left eyelid: Secondary | ICD-10-CM | POA: Insufficient documentation

## 2021-06-07 DIAGNOSIS — M545 Low back pain, unspecified: Secondary | ICD-10-CM

## 2021-06-07 DIAGNOSIS — E739 Lactose intolerance, unspecified: Secondary | ICD-10-CM | POA: Diagnosis not present

## 2021-06-07 DIAGNOSIS — Z0000 Encounter for general adult medical examination without abnormal findings: Secondary | ICD-10-CM

## 2021-06-07 DIAGNOSIS — I10 Essential (primary) hypertension: Secondary | ICD-10-CM | POA: Diagnosis not present

## 2021-06-07 DIAGNOSIS — Z125 Encounter for screening for malignant neoplasm of prostate: Secondary | ICD-10-CM

## 2021-06-07 DIAGNOSIS — G8929 Other chronic pain: Secondary | ICD-10-CM | POA: Diagnosis not present

## 2021-06-07 LAB — POCT URINALYSIS DIP (PROADVANTAGE DEVICE)
Bilirubin, UA: NEGATIVE
Blood, UA: NEGATIVE
Glucose, UA: NEGATIVE mg/dL
Leukocytes, UA: NEGATIVE
Nitrite, UA: NEGATIVE
Protein Ur, POC: 30 mg/dL — AB
Specific Gravity, Urine: 1.02
Urobilinogen, Ur: 0.2
pH, UA: 6 (ref 5.0–8.0)

## 2021-06-07 MED ORDER — AMLODIPINE BESYLATE 5 MG PO TABS: 5.0000 mg | ORAL_TABLET | Freq: Every day | ORAL | 3 refills | Status: DC

## 2021-06-07 NOTE — Addendum Note (Signed)
Addended by: Elyse Jarvis on: 06/07/2021 11:45 AM ? ? Modules accepted: Orders ? ?

## 2021-06-07 NOTE — Progress Notes (Signed)
? ?  Subjective:  ? ? Patient ID: Benjamin Macias, male    DOB: 22-Mar-1961, 61 y.o.   MRN: [id number] ? ?HPI ?He is here for complete examination.  He is now semiretired and enjoying this.  His back is causing very little difficulty.  He did see the ophthalmologist recently and does have evidence of ptosis.  At the present time he feels no need to pursue this any further.  He continues on his blood pressure medication and is having no difficulty with that.  His home life is going well.  Family and social history as well as health maintenance and immunizations was reviewed ? ? ?Review of Systems  ?All other systems reviewed and are negative. ? ?   ?Objective:  ? Physical Exam ?Alert and in no distress. Tympanic membranes and canals are normal. Pharyngeal area is normal. Neck is supple without adenopathy or thyromegaly. Cardiac exam shows a regular sinus rhythm without murmurs or gallops. Lungs are clear to auscultation. ?Abdominal exam shows no masses or tenderness with normal bowel sounds ? ? ? ?   ?Assessment & Plan:  ?Routine general medical examination at a health care facility - Plan: CBC with Differential/Platelet, Comprehensive metabolic panel, Lipid panel ? ?Chronic low back pain without sciatica, unspecified back pain laterality ? ?Lactose intolerance ? ?Essential hypertension - Plan: CBC with Differential/Platelet, Comprehensive metabolic panel, amLODipine (NORVASC) 5 MG tablet ? ?Screening for prostate cancer - Plan: PSA ? ?Ptosis of left eyelid ?Discussed continuing on his present medication regimen as well as diet and exercise.  He is enjoying his summer retirement.  Discussed PSA testing with him and we will follow-up with that. ? ?

## 2021-06-08 ENCOUNTER — Encounter: Payer: Self-pay | Admitting: Family Medicine

## 2021-06-08 LAB — CBC WITH DIFFERENTIAL/PLATELET
Basophils Absolute: 0 10*3/uL (ref 0.0–0.2)
Basos: 1 %
EOS (ABSOLUTE): 0.1 10*3/uL (ref 0.0–0.4)
Eos: 1 %
Hematocrit: 46.3 % (ref 37.5–51.0)
Hemoglobin: 14.7 g/dL (ref 13.0–17.7)
Immature Grans (Abs): 0 10*3/uL (ref 0.0–0.1)
Immature Granulocytes: 0 %
Lymphocytes Absolute: 1.8 10*3/uL (ref 0.7–3.1)
Lymphs: 24 %
MCH: 27.8 pg (ref 26.6–33.0)
MCHC: 31.7 g/dL (ref 31.5–35.7)
MCV: 88 fL (ref 79–97)
Monocytes Absolute: 0.5 10*3/uL (ref 0.1–0.9)
Monocytes: 7 %
Neutrophils Absolute: 5 10*3/uL (ref 1.4–7.0)
Neutrophils: 67 %
Platelets: 187 10*3/uL (ref 150–450)
RBC: 5.29 x10E6/uL (ref 4.14–5.80)
RDW: 12.6 % (ref 11.6–15.4)
WBC: 7.4 10*3/uL (ref 3.4–10.8)

## 2021-06-08 LAB — COMPREHENSIVE METABOLIC PANEL
ALT: 22 IU/L (ref 0–44)
AST: 24 IU/L (ref 0–40)
Albumin/Globulin Ratio: 2.4 — ABNORMAL HIGH (ref 1.2–2.2)
Albumin: 5 g/dL — ABNORMAL HIGH (ref 3.8–4.8)
Alkaline Phosphatase: 49 IU/L (ref 44–121)
BUN/Creatinine Ratio: 18 (ref 10–24)
BUN: 21 mg/dL (ref 8–27)
Bilirubin Total: 0.3 mg/dL (ref 0.0–1.2)
CO2: 21 mmol/L (ref 20–29)
Calcium: 10 mg/dL (ref 8.6–10.2)
Chloride: 104 mmol/L (ref 96–106)
Creatinine, Ser: 1.2 mg/dL (ref 0.76–1.27)
Globulin, Total: 2.1 g/dL (ref 1.5–4.5)
Glucose: 105 mg/dL — ABNORMAL HIGH (ref 70–99)
Potassium: 4.3 mmol/L (ref 3.5–5.2)
Sodium: 142 mmol/L (ref 134–144)
Total Protein: 7.1 g/dL (ref 6.0–8.5)
eGFR: 69 mL/min/{1.73_m2} (ref >59)

## 2021-06-08 LAB — PSA: Prostate Specific Ag, Serum: 1.6 ng/mL (ref 0.0–4.0)

## 2021-06-08 LAB — LIPID PANEL
Chol/HDL Ratio: 4.3 ratio (ref 0.0–5.0)
Cholesterol, Total: 198 mg/dL (ref 100–199)
HDL: 46 mg/dL (ref >39)
LDL Chol Calc (NIH): 141 mg/dL — ABNORMAL HIGH (ref 0–99)
Triglycerides: 62 mg/dL (ref 0–149)
VLDL Cholesterol Cal: 11 mg/dL (ref 5–40)

## 2021-06-30 ENCOUNTER — Telehealth: Payer: Self-pay | Admitting: Family Medicine

## 2021-07-28 NOTE — Telephone Encounter (Signed)
Error

## 2021-12-01 ENCOUNTER — Encounter: Payer: Self-pay | Admitting: Internal Medicine

## 2022-01-04 ENCOUNTER — Encounter: Payer: Self-pay | Admitting: Internal Medicine

## 2022-01-17 ENCOUNTER — Encounter: Payer: Self-pay | Admitting: Internal Medicine

## 2022-01-21 ENCOUNTER — Other Ambulatory Visit (INDEPENDENT_AMBULATORY_CARE_PROVIDER_SITE_OTHER): Payer: 59

## 2022-01-21 DIAGNOSIS — Z23 Encounter for immunization: Secondary | ICD-10-CM

## 2022-05-16 ENCOUNTER — Other Ambulatory Visit: Payer: Self-pay | Admitting: Family Medicine

## 2022-05-16 DIAGNOSIS — I10 Essential (primary) hypertension: Secondary | ICD-10-CM

## 2022-06-10 ENCOUNTER — Encounter: Payer: 59 | Admitting: Family Medicine

## 2022-06-14 ENCOUNTER — Encounter: Payer: 59 | Admitting: Family Medicine

## 2022-06-20 ENCOUNTER — Ambulatory Visit (INDEPENDENT_AMBULATORY_CARE_PROVIDER_SITE_OTHER): Payer: 59 | Admitting: Family Medicine

## 2022-06-20 ENCOUNTER — Encounter: Payer: Self-pay | Admitting: Family Medicine

## 2022-06-20 VITALS — BP 142/72 | HR 63 | Temp 98.1°F | Ht 70.0 in | Wt 209.4 lb

## 2022-06-20 DIAGNOSIS — Z0000 Encounter for general adult medical examination without abnormal findings: Secondary | ICD-10-CM | POA: Diagnosis not present

## 2022-06-20 DIAGNOSIS — Z87891 Personal history of nicotine dependence: Secondary | ICD-10-CM | POA: Diagnosis not present

## 2022-06-20 DIAGNOSIS — E739 Lactose intolerance, unspecified: Secondary | ICD-10-CM

## 2022-06-20 DIAGNOSIS — I10 Essential (primary) hypertension: Secondary | ICD-10-CM | POA: Diagnosis not present

## 2022-06-20 DIAGNOSIS — G8929 Other chronic pain: Secondary | ICD-10-CM

## 2022-06-20 DIAGNOSIS — Z1211 Encounter for screening for malignant neoplasm of colon: Secondary | ICD-10-CM

## 2022-06-20 LAB — POCT URINALYSIS DIP (PROADVANTAGE DEVICE)
Bilirubin, UA: NEGATIVE
Blood, UA: NEGATIVE
Glucose, UA: NEGATIVE mg/dL
Ketones, POC UA: NEGATIVE mg/dL
Leukocytes, UA: NEGATIVE
Nitrite, UA: NEGATIVE
Protein Ur, POC: NEGATIVE mg/dL
Specific Gravity, Urine: 1.015
Urobilinogen, Ur: 0.2
pH, UA: 6 (ref 5.0–8.0)

## 2022-06-20 LAB — LIPID PANEL

## 2022-06-20 MED ORDER — AMLODIPINE BESYLATE 5 MG PO TABS: 5.0000 mg | ORAL_TABLET | Freq: Every day | ORAL | 3 refills | Status: DC

## 2022-06-20 NOTE — Progress Notes (Signed)
Complete physical exam  Patient: Benjamin Macias   DOB: Jul 31, 1960   62 y.o. Male  MRN: [id number]  Subjective:    Chief Complaint  Patient presents with   Annual Exam    Fasting. No additional concerns.     Benjamin Macias is a 62 y.o. male who presents today for a complete physical exam. He reports consuming a general diet. Home exercise routine includes bike riding and weight lifting 2-3 times a week. He generally feels fairly well. He reports sleeping well. He does not have additional problems to discuss today.  He is now retired but still working 3 days a week.  Continues on Norvasc without difficulty.  He is also taking a good multivitamin.  He does keep himself in relatively good shape.  He is a former smoker.  He also has lactose intolerance and takes care of it appropriately.  Otherwise his family and social history as well as health life and immunizations was reviewed   Most recent fall risk assessment:    06/20/2022   10:54 AM  [place] in the past year? 0  Number falls in past yr: 0  Injury with Fall? 0  Risk for fall due to : No Fall Risks  Follow up Falls evaluation completed     Most recent depression screenings:    06/20/2022   10:54 AM 06/07/2021   10:22 AM  PHQ 2/9 Scores  PHQ - 2 Score 0 0    Vision:Within last year and Dental: Receives regular dental care    Patient Care Team: Denita Lung, MD as PCP - General (Family Medicine)   Outpatient Medications Prior to Visit  Medication Sig   amLODipine (NORVASC) 5 MG tablet TAKE 1 TABLET BY MOUTH DAILY   Multiple Vitamins-Minerals (MENS 50+ MULTI VITAMIN/MIN PO) Take by mouth.   No facility-administered medications prior to visit.    Review of Systems  All other systems reviewed and are negative.         Objective:     BP (!) 142/72 (BP Location: Right Arm, Cuff Size: Large)   Pulse 63   Temp 98.1 F (36.7 C) (Oral)   Ht 5\' 10"  (1.778 m)   Wt 209 lb 6.4 oz (95 kg)   SpO2 98%  Comment: room air  BMI 30.05 kg/m    Physical Exam  Alert and in no distress. Tympanic membranes and canals are normal. Pharyngeal area is normal. Neck is supple without adenopathy or thyromegaly. Cardiac exam shows a regular sinus rhythm without murmurs or gallops. Lungs are clear to auscultation.  Results for orders placed or performed in visit on 06/20/22  POCT Urinalysis DIP (Proadvantage Device)  Result Value Ref Range   Color, UA yellow yellow   Clarity, UA clear clear   Glucose, UA negative negative mg/dL   Bilirubin, UA negative negative   Ketones, POC UA negative negative mg/dL   Specific Gravity, Urine 1.015    Blood, UA negative negative   pH, UA 6.0 5.0 - 8.0   Protein Ur, POC negative negative mg/dL   Urobilinogen, Ur 0.2    Nitrite, UA Negative Negative   Leukocytes, UA Negative Negative       Assessment & Plan:    Routine general medical examination at a health care facility - Plan: CBC with Differential/Platelet, Comprehensive metabolic panel, Lipid panel, POCT Urinalysis DIP (Proadvantage Device)  Essential hypertension - Plan: CBC with Differential/Platelet, Comprehensive metabolic panel, amLODipine (NORVASC) 5 MG tablet  Former smoker  Lactose intolerance  Screening for colon cancer - Plan: Cologuard  Immunization History  Administered Date(s) Administered   COVID-19, mRNA, vaccine(Comirnaty)12 years and older 01/21/2022   Influenza Split 01/18/2011, 01/19/2012   Influenza,inj,Quad PF,6+ Mos 01/22/2013, 01/23/2014, 01/26/2015, 01/28/2016, 02/23/2017, 03/06/2018, 01/26/2021, 01/21/2022   Influenza-Unspecified 01/16/2019, 03/05/2020   PFIZER(Purple Top)SARS-COV-2 Vaccination 05/15/2019, 06/06/2019, 01/07/2020   Pfizer Covid-19 Vaccine Bivalent Booster 64yrs & up 12/09/2020   Tdap 01/14/2009, 05/10/2018   Unspecified SARS-COV-2 Vaccination 12/09/2020   Zoster Recombinat (Shingrix) 11/11/2019, 01/14/2020    Health Maintenance  Topic Date Due   Fecal  DNA (Cologuard)  06/16/2022   DTaP/Tdap/Td (3 - Td or Tdap) 05/10/2028   INFLUENZA VACCINE  Completed   COVID-19 Vaccine  Completed   Hepatitis C Screening  Completed   HIV Screening  Completed   Zoster Vaccines- Shingrix  Completed   HPV VACCINES  Aged Out   COLONOSCOPY (Pts 45-42yrs Insurance coverage will need to be confirmed)  Discontinued    Discussed health benefits of physical activity, and encouraged him to engage in regular exercise appropriate for his age and condition. Discussed blood pressure with him and at this point recommend continuing to take good care of himself.  Continue on multivitamin.  We then discussed the fact that he is thinking about getting a screening done for cardiac and lung issues.  Also discussed AAA evaluation when he turns 52. Problem List Items Addressed This Visit       Cardiovascular and Mediastinum   Essential hypertension     Other   Chronic low back pain without sciatica   Former smoker   Lactose intolerance   Other Visit Diagnoses     Routine general medical examination at a health care facility    -  Primary   Relevant Orders   POCT Urinalysis DIP (Proadvantage Device) (Completed)      No follow-ups on file.     Randal Buba, CMA

## 2022-06-21 LAB — CBC WITH DIFFERENTIAL/PLATELET
Basophils Absolute: 0.1 10*3/uL (ref 0.0–0.2)
Basos: 1 %
EOS (ABSOLUTE): 0.2 10*3/uL (ref 0.0–0.4)
Eos: 4 %
Hematocrit: 45.5 % (ref 37.5–51.0)
Hemoglobin: 14.5 g/dL (ref 13.0–17.7)
Immature Grans (Abs): 0 10*3/uL (ref 0.0–0.1)
Immature Granulocytes: 0 %
Lymphocytes Absolute: 2.6 10*3/uL (ref 0.7–3.1)
Lymphs: 51 %
MCH: 27.6 pg (ref 26.6–33.0)
MCHC: 31.9 g/dL (ref 31.5–35.7)
MCV: 87 fL (ref 79–97)
Monocytes Absolute: 0.5 10*3/uL (ref 0.1–0.9)
Monocytes: 9 %
Neutrophils Absolute: 1.8 10*3/uL (ref 1.4–7.0)
Neutrophils: 35 %
Platelets: 143 10*3/uL — ABNORMAL LOW (ref 150–450)
RBC: 5.25 x10E6/uL (ref 4.14–5.80)
RDW: 12.9 % (ref 11.6–15.4)
WBC: 5.1 10*3/uL (ref 3.4–10.8)

## 2022-06-21 LAB — COMPREHENSIVE METABOLIC PANEL
ALT: 20 IU/L (ref 0–44)
AST: 23 IU/L (ref 0–40)
Albumin/Globulin Ratio: 1.9 (ref 1.2–2.2)
Albumin: 4.5 g/dL (ref 3.9–4.9)
Alkaline Phosphatase: 51 IU/L (ref 44–121)
BUN/Creatinine Ratio: 14 (ref 10–24)
BUN: 16 mg/dL (ref 8–27)
Bilirubin Total: 0.4 mg/dL (ref 0.0–1.2)
CO2: 25 mmol/L (ref 20–29)
Calcium: 9.7 mg/dL (ref 8.6–10.2)
Chloride: 101 mmol/L (ref 96–106)
Creatinine, Ser: 1.12 mg/dL (ref 0.76–1.27)
Globulin, Total: 2.4 g/dL (ref 1.5–4.5)
Glucose: 92 mg/dL (ref 70–99)
Potassium: 4.4 mmol/L (ref 3.5–5.2)
Sodium: 140 mmol/L (ref 134–144)
Total Protein: 6.9 g/dL (ref 6.0–8.5)
eGFR: 74 mL/min/{1.73_m2} (ref >59)

## 2022-06-21 LAB — LIPID PANEL
Chol/HDL Ratio: 4.2 ratio (ref 0.0–5.0)
Cholesterol, Total: 196 mg/dL (ref 100–199)
HDL: 47 mg/dL (ref >39)
LDL Chol Calc (NIH): 137 mg/dL — ABNORMAL HIGH (ref 0–99)
Triglycerides: 68 mg/dL (ref 0–149)
VLDL Cholesterol Cal: 12 mg/dL (ref 5–40)

## 2022-07-02 ENCOUNTER — Telehealth: Payer: 59 | Admitting: Nurse Practitioner

## 2022-07-02 DIAGNOSIS — L309 Dermatitis, unspecified: Secondary | ICD-10-CM

## 2022-07-02 MED ORDER — TRIAMCINOLONE ACETONIDE 0.1 % EX CREA: 1.0000 | TOPICAL_CREAM | Freq: Two times a day (BID) | CUTANEOUS | 0 refills | Status: AC

## 2022-07-02 MED ORDER — PREDNISONE 20 MG PO TABS: 40.0000 mg | ORAL_TABLET | Freq: Every day | ORAL | 0 refills | Status: AC

## 2022-07-02 NOTE — Progress Notes (Signed)
Virtual Visit Consent   Benjamin Macias, you are scheduled for a virtual visit with Benjamin Daphine DeutscherMartin, FNP, a [facility] provider, today.     Just as with appointments in the office, your consent must be obtained to participate.  Your consent will be active for this visit and any virtual visit you may have with one of our providers in the next 365 days.     If you have a MyChart account, a copy of this consent can be sent to you electronically.  All virtual visits are billed to your insurance company just like a traditional visit in the office.    As this is a virtual visit, video technology does not allow for your provider to perform a traditional examination.  This may limit your provider's ability to fully assess your condition.  If your provider identifies any concerns that need to be evaluated in person or the need to arrange testing (such as labs, EKG, etc.), we will make arrangements to do so.     Although advances in technology are sophisticated, we cannot ensure that it will always work on either your end or our end.  If the connection with a video visit is poor, the visit may have to be switched to a telephone visit.  With either a video or telephone visit, we are not always able to ensure that we have a secure connection.     I need to obtain your verbal consent now.   Are you willing to proceed with your visit today? YES   Gilbert Mazo has provided verbal consent on 07/02/2022 for a virtual visit (video or telephone).   Benjamin Daphine DeutscherMartin, FNP   Date: 07/02/2022 12:21 PM   Virtual Visit via Video Note   I, Benjamin Macias, connected with Benjamin Macias ([id number], 05/26/1960) on 07/02/22 at 12:30 PM EDT by a video-enabled telemedicine application and verified that I am speaking with the correct person using two identifiers.  Location: Patient: Virtual Visit Location Patient: Home Provider: Virtual Visit Location Provider: Mobile   I discussed the limitations of  evaluation and management by telemedicine and the availability of in person appointments. The patient expressed understanding and agreed to proceed.    History of Present Illness: Benjamin Macias is a 62 y.o. who identifies as a male who was assigned male at birth, and is being seen today for rash.  HPI: Rash This is a new problem. The current episode started 1 to 4 weeks ago. The problem has been waxing and waning since onset. The affected locations include the chest. The rash is characterized by redness, itchiness, dryness and burning. Pertinent negatives include no congestion, cough, fatigue or rhinorrhea. Treatments tried: exoderm OTC. The treatment provided mild relief.    Review of Systems  Constitutional:  Negative for fatigue.  HENT:  Negative for congestion and rhinorrhea.   Respiratory:  Negative for cough.   Skin:  Positive for rash.    Problems:  Patient Active Problem List   Diagnosis Date Noted   Ptosis of left eyelid 06/07/2021   Chronic low back pain without sciatica 05/29/2020   Former smoker 05/23/2019   Essential hypertension 05/23/2019   Lactose intolerance 01/19/2012    Allergies: No Known Allergies Medications:  Current Outpatient Medications:    amLODipine (NORVASC) 5 MG tablet, Take 1 tablet (5 mg total) by mouth daily., Disp: 90 tablet, Rfl: 3   Multiple Vitamins-Minerals (MENS 50+ MULTI VITAMIN/MIN PO), Take by mouth., Disp: , Rfl:   Observations/Objective: Patient is  well-developed, well-nourished in no acute distress.  Resting comfortably  at home.  Head is normocephalic, atraumatic.  No labored breathing.  Speech is clear and coherent with logical content.  Patient is alert and oriented at baseline.  Dark pathcy rash on anterior chest spreading down to middle of abdomen  Assessment and Plan:  Charls Macias in today with chief complaint of Rash   1. Dermatitis Avoid scratching Cool compresses See PCP if not improving  Meds ordered this  encounter  Medications   predniSONE (DELTASONE) 20 MG tablet    Sig: Take 2 tablets (40 mg total) by mouth daily with breakfast for 5 days. 2 po daily for 5 days    Dispense:  10 tablet    Refill:  0    Order Specific Question:   Supervising Provider    Answer:   Merrilee Jansky [[id number]]   triamcinolone cream (KENALOG) 0.1 %    Sig: Apply 1 Application topically 2 (two) times daily.    Dispense:  453 g    Refill:  0    Order Specific Question:   Supervising Provider    Answer:   Merrilee Jansky [id number]        Follow Up Instructions: I discussed the assessment and treatment plan with the patient. The patient was provided an opportunity to ask questions and all were answered. The patient agreed with the plan and demonstrated an understanding of the instructions.  A copy of instructions were sent to the patient via MyChart.  The patient was advised to call back or seek an in-person evaluation if the symptoms worsen or if the condition fails to improve as anticipated.  Time:  I spent 10 minutes with the patient via telehealth technology discussing the above problems/concerns.    Benjamin Daphine Deutscher, FNP Benjamin Macias

## 2022-07-02 NOTE — Patient Instructions (Signed)
  Pedrohenrique Carne, thank you for joining Bennie Pierini, FNP for today's virtual visit.  While this provider is not your primary care provider (PCP), if your PCP is located in our provider database this encounter information will be shared with them immediately following your visit.   A [facility] MyChart account gives you access to today's visit and all your visits, tests, and labs performed at [facility] " click here if you don't have a [facility] MyChart account or go to [email or web address]  Consent: (Patient) Benjamin Macias provided verbal consent for this virtual visit at the beginning of the encounter.  Current Medications:  Current Outpatient Medications:    predniSONE (DELTASONE) 20 MG tablet, Take 2 tablets (40 mg total) by mouth daily with breakfast for 5 days. 2 po daily for 5 days, Disp: 10 tablet, Rfl: 0   triamcinolone cream (KENALOG) 0.1 %, Apply 1 Application topically 2 (two) times daily., Disp: 453 g, Rfl: 0   amLODipine (NORVASC) 5 MG tablet, Take 1 tablet (5 mg total) by mouth daily., Disp: 90 tablet, Rfl: 3   Multiple Vitamins-Minerals (MENS 50+ MULTI VITAMIN/MIN PO), Take by mouth., Disp: , Rfl:    Medications ordered in this encounter:  Meds ordered this encounter  Medications   predniSONE (DELTASONE) 20 MG tablet    Sig: Take 2 tablets (40 mg total) by mouth daily with breakfast for 5 days. 2 po daily for 5 days    Dispense:  10 tablet    Refill:  0    Order Specific Question:   Supervising Provider    Answer:   Merrilee Jansky [[id number]]   triamcinolone cream (KENALOG) 0.1 %    Sig: Apply 1 Application topically 2 (two) times daily.    Dispense:  453 g    Refill:  0    Order Specific Question:   Supervising Provider    Answer:   Merrilee Jansky [id number]     *If you need refills on other medications prior to your next appointment, please contact your pharmacy*  Follow-Up: Call back or seek an in-person evaluation if the symptoms  worsen or if the condition fails to improve as anticipated.  [facility] [phone]  Other Instructions Avoid scratching Cool compresses See PCP if not improving   If you have been instructed to have an in-person evaluation today at a local Urgent Care facility, please use the link below. It will take you to a list of all of our available [facility], including address, phone number and hours of operation. Please do not delay care.  [facility]  If you or a family member do not have a primary care provider, use the link below to schedule a visit and establish care. When you choose a [facility] primary care physician or advanced practice provider, you gain a long-term partner in health. Find a Primary Care Provider  Learn more about [facility]'s in-office and virtual care options: [facility] - Get Care Now

## 2022-07-11 ENCOUNTER — Other Ambulatory Visit (HOSPITAL_COMMUNITY): Payer: Self-pay

## 2022-07-11 ENCOUNTER — Ambulatory Visit: Payer: 59 | Admitting: Medical

## 2022-07-11 ENCOUNTER — Telehealth: Payer: Self-pay | Admitting: Family Medicine

## 2022-07-11 VITALS — BP 112/70 | HR 49 | Temp 97.4°F | Wt 205.0 lb

## 2022-07-11 DIAGNOSIS — L309 Dermatitis, unspecified: Secondary | ICD-10-CM | POA: Diagnosis not present

## 2022-07-11 LAB — COLOGUARD: COLOGUARD: NEGATIVE

## 2022-07-11 MED ORDER — CLOTRIMAZOLE-BETAMETHASONE 1-0.05 % EX CREA: 1.0000 | TOPICAL_CREAM | Freq: Every day | CUTANEOUS | 0 refills | Status: DC

## 2022-07-11 MED ORDER — CLOTRIMAZOLE-BETAMETHASONE 1-0.05 % EX CREA
1.0000 | TOPICAL_CREAM | Freq: Every day | CUTANEOUS | 0 refills | Status: DC
  Filled 2022-07-11: qty 45, 15d supply, fill #0

## 2022-07-11 NOTE — Progress Notes (Signed)
Subjective:  Benjamin Macias is a 62 y.o. male who presents for Chief Complaint  Patient presents with   rash on chest    Rash on chest for the last 6 week. Did a e-visit last week and got prednisone and cream and it helped but then moved to another area, very itchy     Here for rash.  Been going on since end of March. Started in between breasts, but it continue to spread.   Rash is quite itchy.  Did video visit last week for same.   Started the prednisone oral steroid and triamcinolone cream prescribed 07/02/22.  At this point part of the rash in the lower area get better but then now has worse rash rising up towards the top of the statin.  No triggers, no yard work, no other aggravating factors, has not sprayed cologne or deodorant in that area.  No prior similar. No other aggravating or relieving factors.    No other c/o.  The following portions of the patient's history were reviewed and updated as appropriate: allergies, current medications, past family history, past medical history, past social history, past surgical history and problem list.  ROS Otherwise as in subjective above  Objective: BP 112/70   Pulse (!) 49   Temp (!) 97.4 F (36.3 C)   Wt 205 lb (93 kg)   BMI 29.41 kg/m   General appearance: alert, no distress, well developed, well nourished Skin: Somewhat of a T shaped rash going from the top of the sternum down between the breast and a little bit under both breast but worse now the top of the sternum.  The rash lower in the chest has gotten a little better. There is a general look of irritation and breast skin but no significant erythema.  No pustules or vesicles.  He does have scattered small skin tags on the chest wall and on bilateral anterior neck.   Assessment: Encounter Diagnosis  Name Primary?   Dermatitis Yes     Plan: 9 days ago he had a video visit and was prescribed oral prednisone and topical triamcinolone.  The lower part of the rash on his chest has  looked a little bit better but not completely cleared up.  Now there is worse rash higher of the sternum.  I wonder if there could be a fungal component as well given we are entering warmer months and there could be some sweat accumulate in the same area.  We will try the cream below instead of triamcinolone for the next 7 to 10 days.  No obvious need for additional prednisone or steroids by mouth.  Also has some use of Selsun Blue twice a week to the scalp and beard, massage in and leave on for 5 to 10 minutes before rinsing off.  Do this twice a week for the next few weeks  Use caution not to overuse the cream.  If not completely cleared up within the next 7 to 10 days then call back or recheck.  Benjamin Macias was seen today for rash on chest.  Diagnoses and all orders for this visit:  Dermatitis  Other orders -     clotrimazole-betamethasone (LOTRISONE) cream; Apply 1 Application topically daily.    Follow up: prn

## 2022-07-11 NOTE — Telephone Encounter (Signed)
[facility] is out of Lotrisone, Called [facility] & they have in stock, sent to [facility]

## 2022-07-15 DIAGNOSIS — I7090 Unspecified atherosclerosis: Secondary | ICD-10-CM

## 2022-07-15 HISTORY — DX: Unspecified atherosclerosis: I70.90

## 2022-07-21 ENCOUNTER — Ambulatory Visit: Payer: 59 | Admitting: Medical

## 2022-07-21 ENCOUNTER — Encounter: Payer: Self-pay | Admitting: Medical

## 2022-07-21 VITALS — BP 120/80 | HR 52 | Wt 208.2 lb

## 2022-07-21 DIAGNOSIS — L918 Other hypertrophic disorders of the skin: Secondary | ICD-10-CM

## 2022-07-21 DIAGNOSIS — L309 Dermatitis, unspecified: Secondary | ICD-10-CM | POA: Diagnosis not present

## 2022-07-21 MED ORDER — FLUCONAZOLE 150 MG PO TABS: 150.0000 mg | ORAL_TABLET | ORAL | 0 refills | Status: DC

## 2022-07-21 NOTE — Patient Instructions (Signed)
Recommendations For the next 5 to 7 days, continue the Lotrisone cream prescribed last visit but do this twice a day Do not continue to use it after another 7 days Begin oral Diflucan tablet 1 tablet/week for 2 to 3 weeks until the rash is gone After 1 week if the rash is still present but improved I would rather you switch to a daily moisturizing lotion at that time such as Lubriderm, Aquaphor, Cetaphil or similar I did place a referral today for dermatology so expect a phone call about this

## 2022-07-21 NOTE — Progress Notes (Signed)
Subjective:  Saw Benjamin Macias is a 62 y.o. male who presents for Chief Complaint  Patient presents with   Acute Visit    Pt advised he's starting to heal but he still has concerns about the spot on his chest     Here for rash.  I saw him 07/11/2022 for the same.  Since last visit he has been using Lotrisone cream and has seen some improvement particular in the middle of the chest.  It really helped with the itching but the rash is not completely gone.  He also notes several new bumps at the top of the right chest.  He has other skin tags.  He initially did a virtual visit on 07/02/2022.  Started the prednisone oral steroid and triamcinolone cream prescribed 07/02/22.  At this point part of the rash in the lower area get better but then now has worse rash rising up towards the top of the statin.  No triggers, no yard work, no other aggravating factors, has not sprayed cologne or deodorant in that area.  No prior similar. No other aggravating or relieving factors.    No other c/o.  The following portions of the patient's history were reviewed and updated as appropriate: allergies, current medications, past family history, past medical history, past social history, past surgical history and problem list.  ROS Otherwise as in subjective above  Objective: BP 120/80   Pulse (!) 52   Wt 208 lb 3.2 oz (94.4 kg)   SpO2 99%   BMI 29.87 kg/m   General appearance: alert, no distress, well developed, well nourished Skin: Today there is still somewhat rough skin and a triangular patch of the top of the chest at the top of the sternum and then another similar triangular patch of rash at the bottom of the sternum midline.  Compared to last visit there is no rash connecting the 2 areas now and it does look overall improved compared to 10 days ago. There is several small 1 to 2 mm skin tags along his right upper chest, there is several small pedunculated skin tags along the anterior left and right  neck   Assessment: Encounter Diagnoses  Name Primary?   Dermatitis Yes   Multiple acquired skin tags       Plan: Given the dermatitis likely fungal, I do feel that things are improving.  We will go a little bit longer with the Lotrisone cream twice a day for the next 5 to 7 days.  Begin oral Diflucan.  After 1 week stop the Lotrisone cream and just use a daily moisturizing lotion.  I did go ahead and place referral today as he wants to have a follow-up with dermatology on this.  We discussed the skin tags, the benign nature of these.  He does not want to pursue excision or other therapy at this time for skin tags.  [name] was seen today for acute visit.  Diagnoses and all orders for this visit:  Dermatitis -     Ambulatory referral to Dermatology  Multiple acquired skin tags -     Ambulatory referral to Dermatology  Other orders -     fluconazole (DIFLUCAN) 150 MG tablet; Take 1 tablet (150 mg total) by mouth once a week.     Follow up: prn

## 2022-07-26 ENCOUNTER — Encounter: Payer: Self-pay | Admitting: Family Medicine

## 2022-07-28 ENCOUNTER — Encounter: Payer: Self-pay | Admitting: Family Medicine

## 2022-07-28 ENCOUNTER — Telehealth: Payer: 59 | Admitting: Family Medicine

## 2022-07-28 DIAGNOSIS — E041 Nontoxic single thyroid nodule: Secondary | ICD-10-CM | POA: Diagnosis not present

## 2022-07-28 DIAGNOSIS — I700 Atherosclerosis of aorta: Secondary | ICD-10-CM

## 2022-07-28 DIAGNOSIS — N281 Cyst of kidney, acquired: Secondary | ICD-10-CM

## 2022-07-28 MED ORDER — ATORVASTATIN CALCIUM 20 MG PO TABS: 20.0000 mg | ORAL_TABLET | Freq: Every day | ORAL | 3 refills | Status: DC

## 2022-07-28 NOTE — Progress Notes (Signed)
   Subjective:    Patient ID: Benjamin Macias, male    DOB: Sep 30, 1960, 62 y.o.   MRN: [id number]  HPI Documentation for virtual audio and video telecommunications through [facility] encounter:  The patient was located at home. 2 patient identifiers used.  The provider was located in the office. The patient did consent to this visit and is aware of possible charges through their insurance for this visit.  The other persons participating in this telemedicine service were none. Time spent on call was 5 minutes and in review of previous records >20 minutes total for counseling and coordination of care.  This virtual service is not related to other E/M service within previous 7 days.  He had a body scan done to be proactive with his health care.  The scan showed evidence of a left thyroid nodule of 1 point centimeters.  It also showed evidence of aortic atherosclerosis.  There testing also looked at cardiac calcium score and it was 8.  They also showed a left renal cyst. Review of Systems     Objective:   Physical Exam The 10-year ASCVD risk score (Arnett DK, et al., 2019) is: 13%   Values used to calculate the score:     Age: 58 years     Sex: Male     Is Non-Hispanic African American: Yes     Diabetic: No     Tobacco smoker: No     Systolic Blood Pressure: 120 mmHg     Is BP treated: Yes     HDL Cholesterol: 47 mg/dL     Total Cholesterol: 196 mg/dL  I also reviewed his note from the body scan indicating a 1.8 cm nodule as well as aortic atherosclerosis and his cardiac calcium score.      Assessment & Plan:  Aortic atherosclerosis (HCC) - Plan: atorvastatin (LIPITOR) 20 MG tablet  Thyroid nodule - Plan: US THYROID  Renal cyst I explained that the aortic atherosclerosis especially on top of screening test showing 13% risk of ASHD.  Recommend Lipitor for this.  He was comfortable with that. Ultrasound will need to be done on the thyroid nodule. Explained that the renal cyst is  really of no major concern.

## 2022-08-05 ENCOUNTER — Encounter: Payer: Self-pay | Admitting: Internal Medicine

## 2022-08-08 ENCOUNTER — Other Ambulatory Visit: Payer: 59

## 2022-08-10 ENCOUNTER — Ambulatory Visit
Admission: RE | Admit: 2022-08-10 | Discharge: 2022-08-10 | Disposition: A | Discharge status: Home / Self Care | Payer: 59 | Source: Ambulatory Visit | Attending: Family Medicine | Admitting: Family Medicine

## 2022-08-15 ENCOUNTER — Telehealth: Payer: Self-pay | Admitting: Family Medicine

## 2022-08-15 NOTE — Telephone Encounter (Signed)
Pt called and states that he got his Thyroid ultrasound results thru mychart. Would like to speak to someone concerning results. Pt can be reached at [phone].

## 2022-08-15 NOTE — Telephone Encounter (Signed)
Can you please advise [place] of what to tell patient about results. I will see if I can forward the results to you as well

## 2022-08-16 ENCOUNTER — Other Ambulatory Visit: Payer: Self-pay | Admitting: Internal Medicine

## 2022-08-16 DIAGNOSIS — E042 Nontoxic multinodular goiter: Secondary | ICD-10-CM

## 2022-08-17 ENCOUNTER — Other Ambulatory Visit: Payer: Self-pay | Admitting: Family Medicine

## 2022-08-17 DIAGNOSIS — E042 Nontoxic multinodular goiter: Secondary | ICD-10-CM

## 2022-08-19 ENCOUNTER — Other Ambulatory Visit: Payer: Self-pay

## 2022-08-19 ENCOUNTER — Telehealth: Payer: Self-pay | Admitting: Family Medicine

## 2022-08-19 DIAGNOSIS — I700 Atherosclerosis of aorta: Secondary | ICD-10-CM

## 2022-08-19 MED ORDER — ATORVASTATIN CALCIUM 20 MG PO TABS
20.0000 mg | ORAL_TABLET | Freq: Every day | ORAL | 3 refills | Status: DC
Start: 2022-08-19 — End: 2023-06-26

## 2022-08-19 NOTE — Telephone Encounter (Signed)
Fax from [facility]  Atorvastatin   90 day supply

## 2022-09-09 ENCOUNTER — Encounter: Payer: Self-pay | Admitting: Family Medicine

## 2022-09-09 ENCOUNTER — Ambulatory Visit: Payer: 59 | Admitting: Family Medicine

## 2022-09-09 VITALS — BP 124/76 | HR 65 | Wt 212.8 lb

## 2022-09-09 DIAGNOSIS — I700 Atherosclerosis of aorta: Secondary | ICD-10-CM | POA: Insufficient documentation

## 2022-09-09 DIAGNOSIS — E785 Hyperlipidemia, unspecified: Secondary | ICD-10-CM | POA: Diagnosis not present

## 2022-09-09 LAB — LIPID PANEL
Chol/HDL Ratio: 2.5 ratio (ref 0.0–5.0)
Cholesterol, Total: 116 mg/dL (ref 100–199)
HDL: 47 mg/dL (ref >39)
LDL Chol Calc (NIH): 59 mg/dL (ref 0–99)
Triglycerides: 40 mg/dL (ref 0–149)
VLDL Cholesterol Cal: 10 mg/dL (ref 5–40)

## 2022-09-09 NOTE — Progress Notes (Signed)
   Subjective:    Patient ID: Benjamin Macias, male    DOB: 02/08/1961, 62 y.o.   MRN: [id number]  HPI He is here for recheck on his cholesterol.  He did get a scanning procedure done which did show some thyroid nodules, evidence of aortic atherosclerosis.  Also the scan showed several other benign lesions that I proceeded to explain to him milligray to concern.  He is taking Lipitor and having no aches or pains.  The 10-year ASCVD risk score (Arnett DK, et al., 2019) is: 14.3%   Values used to calculate the score:     Age: 47 years     Sex: Male     Is Non-Hispanic African American: Yes     Diabetic: No     Tobacco smoker: No     Systolic Blood Pressure: 124 mmHg     Is BP treated: Yes     HDL Cholesterol: 47 mg/dL     Total Cholesterol: 196 mg/dL    Review of Systems     Objective:   Physical Exam Alert and in no distress otherwise not examined       Assessment & Plan:  Hyperlipidemia, unspecified hyperlipidemia type - Plan: Lipid panel  Aortic atherosclerosis (HCC) - Plan: Lipid panel I discussed the risk of cardiovascular disease in regard to the aortic atherosclerosis and lipids.  We will check his numbers today and see how he is doing.  He feels comfortable with this.

## 2022-09-14 ENCOUNTER — Ambulatory Visit
Admission: RE | Admit: 2022-09-14 | Discharge: 2022-09-14 | Disposition: A | Discharge status: Home / Self Care | Payer: 59 | Source: Ambulatory Visit | Attending: Family Medicine | Admitting: Family Medicine

## 2022-09-14 ENCOUNTER — Other Ambulatory Visit (HOSPITAL_COMMUNITY)
Admission: RE | Admit: 2022-09-14 | Discharge: 2022-09-14 | Disposition: A | Discharge status: Home / Self Care | Payer: 59 | Source: Ambulatory Visit | Attending: Family Medicine | Admitting: Family Medicine

## 2022-09-14 DIAGNOSIS — E042 Nontoxic multinodular goiter: Secondary | ICD-10-CM | POA: Diagnosis present

## 2022-09-15 ENCOUNTER — Other Ambulatory Visit: Payer: Self-pay | Admitting: Radiology

## 2022-09-16 LAB — CYTOLOGY - NON PAP

## 2022-09-16 LAB — NEEDLESTICK INJURED PATIENT
ALT: 16 U/L (ref 9–46)
HIV 1&2 Ab, 4th Generation: NONREACTIVE
Hep B S AB Quant (Post): <5 m[IU]/mL — ABNORMAL LOW (ref >=10)
Hepatitis B Surface Ag: NONREACTIVE
Hepatitis C Ab: NONREACTIVE

## 2022-09-27 ENCOUNTER — Encounter: Payer: Self-pay | Admitting: Family Medicine

## 2023-02-06 ENCOUNTER — Other Ambulatory Visit (INDEPENDENT_AMBULATORY_CARE_PROVIDER_SITE_OTHER): Payer: 59

## 2023-02-06 DIAGNOSIS — Z23 Encounter for immunization: Secondary | ICD-10-CM | POA: Diagnosis not present

## 2023-05-08 ENCOUNTER — Ambulatory Visit: Payer: 59 | Admitting: Medical

## 2023-05-08 ENCOUNTER — Encounter: Payer: Self-pay | Admitting: Medical

## 2023-05-08 VITALS — BP 134/78 | HR 74 | Temp 97.1°F | Ht 70.0 in | Wt 218.2 lb

## 2023-05-08 DIAGNOSIS — R052 Subacute cough: Secondary | ICD-10-CM

## 2023-05-08 DIAGNOSIS — R0989 Other specified symptoms and signs involving the circulatory and respiratory systems: Secondary | ICD-10-CM

## 2023-05-08 DIAGNOSIS — R058 Other specified cough: Secondary | ICD-10-CM

## 2023-05-08 MED ORDER — AMOXICILLIN 875 MG PO TABS: 875.0000 mg | ORAL_TABLET | Freq: Two times a day (BID) | ORAL | 0 refills | Status: AC

## 2023-05-08 MED ORDER — PREDNISONE 20 MG PO TABS: ORAL_TABLET | ORAL | 0 refills | Status: DC

## 2023-05-08 NOTE — Progress Notes (Signed)
 Subjective:  Benjamin Macias is a 63 y.o. male who presents for Chief Complaint  Patient presents with   Cough    Started as ST first of the year, chest congestion and cough. No runny nose or sneezing. Will think he getting better than will flare up. Mucus is dark yellow/brown esp in the mornings. Last night took Coricidin HBP tablets.      Here for cough, congestion, been dealing with this for a month.  Gets some better, then worse again.  Has ongoing phlegm, sore throat, chest congestion, cough.  No sneezing.  Does have colored mucous.   Had fever 2 weeks ago, but not now.   No hx/o asthma or lung disease.  No wheezing, no SOB.   Fatigue+.  Using OTC coricidin HBP.  No sick contacts.  Non-smoker.  No concern for mold or other new allergens in the house.  No current sick contacts in the last week.  No other aggravating or relieving factors.    No other c/o.  Past Medical History:  Diagnosis Date   Atherosclerosis 07/15/2022   Mild Aortic   Left thyroid  nodule    Current Outpatient Medications on File Prior to Visit  Medication Sig Dispense Refill   amLODipine  (NORVASC ) 5 MG tablet Take 1 tablet (5 mg total) by mouth daily. 90 tablet 3   atorvastatin  (LIPITOR) 20 MG tablet Take 1 tablet (20 mg total) by mouth daily. 90 tablet 3   DM-APAP-CPM (CORICIDIN HBP PO) Take 2 tablets by mouth as needed.     Multiple Vitamins-Minerals (MENS 50+ MULTI VITAMIN/MIN PO) Take by mouth.     clotrimazole -betamethasone  (LOTRISONE ) cream Apply 1 Application topically daily. (Patient not taking: Reported on 05/08/2023) 45 g 0   triamcinolone  cream (KENALOG ) 0.1 % Apply 1 Application topically 2 (two) times daily. (Patient not taking: Reported on 05/08/2023) 453 g 0   No current facility-administered medications on file prior to visit.    The following portions of the patient's history were reviewed and updated as appropriate: allergies, current medications, past family history, past medical history, past social  history, past surgical history and problem list.  ROS Otherwise as in subjective above    Objective: BP 134/78   Pulse 74   Temp (!) 97.1 F (36.2 C) (Tympanic)   Ht 5\' 10"  (1.778 m)   Wt 218 lb 3.4 oz (99 kg)   SpO2 98%   BMI 31.31 kg/m   General appearance: alert, no distress, well developed, well nourished HEENT: normocephalic, sclerae anicteric, conjunctiva pink and moist, TMs flat, nares with mild turbinate edema and some minimal mucoid  discharge, +erythema, pharynx normal Oral cavity: MMM, no lesions Neck: supple, no lymphadenopathy, no thyromegaly, no masses Heart: RRR, normal S1, S2, no murmurs Lungs: CTA bilaterally, no wheezes, rhonchi, or rales Pulses: 2+ radial pulses, 2+ pedal pulses, normal cap refill Ext: no edema   Assessment: Encounter Diagnoses  Name Primary?   Subacute cough Yes   Cough productive of purulent sputum    Chest congestion      Plan: We discussed symptoms and ongoing congestion and mucus.  Symptoms have waxed and waned but not completely resolved.  Begin medication below.  Increase water intake, consider over-the-counter Mucinex or even allergy pill the next few days.  If not much improved significantly by the end of the week in the next 5 days then call back.  Consider CBC and chest x-ray if not improving.  Rasaan was seen today for cough.  Diagnoses and  all orders for this visit:  Subacute cough  Cough productive of purulent sputum  Chest congestion  Other orders -     amoxicillin  (AMOXIL ) 875 MG tablet; Take 1 tablet (875 mg total) by mouth 2 (two) times daily for 10 days. -     predniSONE  (DELTASONE ) 20 MG tablet; 3 tablets today, 2 tablets tomorrow, 1 tablet the third day    Follow up: prn

## 2023-05-23 ENCOUNTER — Encounter: Payer: Self-pay | Admitting: Internal Medicine

## 2023-06-27 ENCOUNTER — Ambulatory Visit: Payer: 59 | Admitting: Family Medicine

## 2023-06-27 VITALS — BP 120/70 | HR 70 | Ht 70.0 in | Wt 205.8 lb

## 2023-06-27 DIAGNOSIS — Z125 Encounter for screening for malignant neoplasm of prostate: Secondary | ICD-10-CM

## 2023-06-27 DIAGNOSIS — Z0000 Encounter for general adult medical examination without abnormal findings: Secondary | ICD-10-CM | POA: Diagnosis not present

## 2023-06-27 DIAGNOSIS — I700 Atherosclerosis of aorta: Secondary | ICD-10-CM

## 2023-06-27 DIAGNOSIS — I10 Essential (primary) hypertension: Secondary | ICD-10-CM

## 2023-06-27 DIAGNOSIS — Z23 Encounter for immunization: Secondary | ICD-10-CM | POA: Diagnosis not present

## 2023-06-27 DIAGNOSIS — H02402 Unspecified ptosis of left eyelid: Secondary | ICD-10-CM

## 2023-06-27 DIAGNOSIS — M545 Low back pain, unspecified: Secondary | ICD-10-CM | POA: Diagnosis not present

## 2023-06-27 DIAGNOSIS — Z87891 Personal history of nicotine dependence: Secondary | ICD-10-CM

## 2023-06-27 DIAGNOSIS — E739 Lactose intolerance, unspecified: Secondary | ICD-10-CM | POA: Diagnosis not present

## 2023-06-27 DIAGNOSIS — G8929 Other chronic pain: Secondary | ICD-10-CM

## 2023-06-27 LAB — LIPID PANEL

## 2023-06-27 MED ORDER — ATORVASTATIN CALCIUM 20 MG PO TABS
20.0000 mg | ORAL_TABLET | Freq: Every day | ORAL | 3 refills | Status: AC
Start: 2023-06-27 — End: 2024-06-26

## 2023-06-27 MED ORDER — AMLODIPINE BESYLATE 5 MG PO TABS
5.0000 mg | ORAL_TABLET | Freq: Every day | ORAL | 3 refills | Status: AC
Start: 2023-06-27 — End: ?

## 2023-06-27 NOTE — Progress Notes (Signed)
 Complete physical exam  Patient: Benjamin Macias   DOB: [birth year]   63 y.o. Male  MRN: [id number]  Subjective:    Chief Complaint  Patient presents with   Annual Exam    Fasting annual exam. Has question about lab result from -Hep B AB Quant (in chart).    Benjamin Macias is a 63 y.o. male who presents today for a complete physical exam.  He reports consuming a general diet.  Cardio, cycling-4 days a week, 60-70 mon per day.  He generally feels well. He reports sleeping well.  He is retired but is still working 3 days a week essentially in the same job.  He does have a history of back pain but is not commenting about that today.  He continues on amlodipine for his blood pressure.  He is a former smoker.  Does have a history of lactose intolerance.  He is interested in having prostate cancer screening.  His home life is going quite well.  Most recent fall risk assessment:    06/27/2023    3:14 PM  Fall Risk   Falls in the past year? 0  Number falls in past yr: 0  Injury with Fall? 0  Risk for fall due to : No Fall Risks  Follow up Falls evaluation completed     Most recent depression screenings:    06/27/2023    3:14 PM 06/20/2022   10:54 AM  PHQ 2/9 Scores  PHQ - 2 Score 0 0    Vision:Within last year    Immunization History  Administered Date(s) Administered   Influenza Split 01/18/2011, 01/19/2012   Influenza, Seasonal, Injecte, Preservative Fre 02/06/2023   Influenza,inj,Quad PF,6+ Mos 01/22/2013, 01/23/2014, 01/26/2015, 01/28/2016, 02/23/2017, 03/06/2018, 01/26/2021, 01/21/2022   Influenza-Unspecified 01/16/2019, 03/05/2020   PFIZER(Purple Top)SARS-COV-2 Vaccination 05/15/2019, 06/06/2019, 01/07/2020   Pfizer Covid-19 Vaccine Bivalent Booster 15yrs & up 12/09/2020   Pfizer(Comirnaty)Fall Seasonal Vaccine 12 years and older 01/21/2022, 02/06/2023   Tdap 01/14/2009, 05/10/2018   Unspecified SARS-COV-2 Vaccination 12/09/2020   Zoster Recombinant(Shingrix) 11/11/2019,  01/14/2020    Health Maintenance  Topic Date Due   COVID-19 Vaccine (7 - Pfizer risk 2024-25 season) 08/06/2023   INFLUENZA VACCINE  10/27/2023   Fecal DNA (Cologuard)  07/03/2025   DTaP/Tdap/Td (3 - Td or Tdap) 05/10/2028   Hepatitis C Screening  Completed   HIV Screening  Completed   Zoster Vaccines- Shingrix  Completed   HPV VACCINES  Aged Out   Colonoscopy  Discontinued    Patient Care Team: Ronnald Nian, MD as PCP - General (Family Medicine)  Optho-Dr. Sallye Lat Dentist-Dr. Mottinger GI-Dr.Stark Derm-Dr. Juanita Craver   Outpatient Medications Prior to Visit  Medication Sig Note   Multiple Vitamins-Minerals (MENS 50+ MULTI VITAMIN/MIN PO) Take by mouth.    clotrimazole-betamethasone (LOTRISONE) cream Apply 1 Application topically daily. (Patient not taking: Reported on 06/27/2023) 06/27/2023: As needed   triamcinolone cream (KENALOG) 0.1 % Apply 1 Application topically 2 (two) times daily. (Patient not taking: Reported on 06/27/2023) 06/27/2023: As needed   [DISCONTINUED] amLODipine (NORVASC) 5 MG tablet Take 1 tablet (5 mg total) by mouth daily.    [DISCONTINUED] atorvastatin (LIPITOR) 20 MG tablet Take 1 tablet (20 mg total) by mouth daily.    [DISCONTINUED] DM-APAP-CPM (CORICIDIN HBP PO) Take 2 tablets by mouth as needed.    [DISCONTINUED] predniSONE (DELTASONE) 20 MG tablet 3 tablets today, 2 tablets tomorrow, 1 tablet the third day    No facility-administered medications prior to visit.  Review of Systems  All other systems reviewed and are negative.   Family and social history as well as health maintenance and immunizations was reviewed.     Objective:    BP 120/70   Pulse 70   Ht 5\' 10"  (1.778 m)   Wt 205 lb 12.8 oz (93.4 kg)   SpO2 99%   BMI 29.53 kg/m    Physical Exam  Alert and in no distress. Tympanic membranes and canals are normal. Pharyngeal area is normal. Neck is supple without adenopathy or thyromegaly. Cardiac exam shows a regular sinus rhythm  without murmurs or gallops. Lungs are clear to auscultation.  Ptosis again noted in the right eye.      Assessment & Plan:     Routine general medical examination at a health care facility  Aortic atherosclerosis (HCC) - Plan: CBC with Differential/Platelet, Comprehensive metabolic panel with GFR, Lipid panel, atorvastatin (LIPITOR) 20 MG tablet  Chronic low back pain without sciatica, unspecified back pain laterality  Essential hypertension - Plan: CBC with Differential/Platelet, Comprehensive metabolic panel with GFR, amLODipine (NORVASC) 5 MG tablet  Former smoker  Lactose intolerance  Ptosis of left eyelid  Need for vaccination against Streptococcus pneumoniae - Plan: Pneumococcal conjugate vaccine 20-valent (Prevnar 20)  Screening for prostate cancer - Plan: PSA  Discussed prostate cancer screening with him.  Apparently a friend has had problems with this recently. Return in about 1 year (around 06/26/2024).      Sharlot Gowda, MD

## 2023-06-28 ENCOUNTER — Encounter: Payer: Self-pay | Admitting: Family Medicine

## 2023-06-28 LAB — CBC WITH DIFFERENTIAL/PLATELET
Basophils Absolute: 0 10*3/uL (ref 0.0–0.2)
Basos: 1 %
EOS (ABSOLUTE): 0.1 10*3/uL (ref 0.0–0.4)
Eos: 3 %
Hematocrit: 40.1 % (ref 37.5–51.0)
Hemoglobin: 12.7 g/dL — ABNORMAL LOW (ref 13.0–17.7)
Immature Grans (Abs): 0 10*3/uL (ref 0.0–0.1)
Immature Granulocytes: 0 %
Lymphocytes Absolute: 2.1 10*3/uL (ref 0.7–3.1)
Lymphs: 50 %
MCH: 27.8 pg (ref 26.6–33.0)
MCHC: 31.7 g/dL (ref 31.5–35.7)
MCV: 88 fL (ref 79–97)
Monocytes Absolute: 0.5 10*3/uL (ref 0.1–0.9)
Monocytes: 10 %
Neutrophils Absolute: 1.6 10*3/uL (ref 1.4–7.0)
Neutrophils: 36 %
Platelets: 163 10*3/uL (ref 150–450)
RBC: 4.57 x10E6/uL (ref 4.14–5.80)
RDW: 12.9 % (ref 11.6–15.4)
WBC: 4.3 10*3/uL (ref 3.4–10.8)

## 2023-06-28 LAB — COMPREHENSIVE METABOLIC PANEL WITH GFR
ALT: 27 IU/L (ref 0–44)
AST: 35 IU/L (ref 0–40)
Albumin: 4.8 g/dL (ref 3.9–4.9)
Alkaline Phosphatase: 48 IU/L (ref 44–121)
BUN/Creatinine Ratio: 14 (ref 10–24)
BUN: 16 mg/dL (ref 8–27)
Bilirubin Total: 0.7 mg/dL (ref 0.0–1.2)
CO2: 22 mmol/L (ref 20–29)
Calcium: 9.4 mg/dL (ref 8.6–10.2)
Chloride: 102 mmol/L (ref 96–106)
Creatinine, Ser: 1.11 mg/dL (ref 0.76–1.27)
Globulin, Total: 2.1 g/dL (ref 1.5–4.5)
Glucose: 87 mg/dL (ref 70–99)
Potassium: 4 mmol/L (ref 3.5–5.2)
Sodium: 140 mmol/L (ref 134–144)
Total Protein: 6.9 g/dL (ref 6.0–8.5)
eGFR: 75 mL/min/{1.73_m2} (ref >59)

## 2023-06-28 LAB — PSA: Prostate Specific Ag, Serum: 1.6 ng/mL (ref 0.0–4.0)

## 2023-06-28 LAB — LIPID PANEL
Cholesterol, Total: 111 mg/dL (ref 100–199)
HDL: 41 mg/dL (ref >39)
LDL CALC COMMENT:: 2.7 ratio (ref 0.0–5.0)
LDL Chol Calc (NIH): 59 mg/dL (ref 0–99)
Triglycerides: 47 mg/dL (ref 0–149)
VLDL Cholesterol Cal: 11 mg/dL (ref 5–40)

## 2023-07-31 ENCOUNTER — Ambulatory Visit: Payer: Self-pay

## 2023-07-31 NOTE — Telephone Encounter (Signed)
 Chief Complaint: knee swelling, discomfort Symptoms: knee swelling and pain Frequency:  Pertinent Negatives: Patient denies fever Disposition: [] ED /[] Urgent Care (no appt availability in office) / [x] Appointment(In office/virtual)/ []  [place] [facility]/ [] Home Care/ [] Refused Recommended Disposition /[] [place] [facility]/ []  Follow-up with PCP Additional Notes:  Left knee mild swelling and mild discomfort started on Thursday and persisting. No recent injuries. More painful with ROM, unable to cross legs. He had surgery on left knee in 1993 for meniscus tear repair, he states he specifically remembers the surgeon stating he may have problems with his knee in the future because of the repair, states this is the first time since surgery that he has had any issues. No new injuries sustained. Next available acute appointment scheduled on 08/01/23 with PCP, added to wait list as patient would like appointment today if possible. Educated on care advice as documented in protocol, patient verbalized understanding. Discussed reasons to call back.    Copied from CRM [id number]. Topic: Clinical - Red Word Triage >> Jul 31, 2023 10:44 AM Baldomero Bone wrote: Red Word that prompted transfer to Nurse Triage: issues with left knee swelling and tightness that started last Tuesday. callback number is [phone] Reason for Disposition  MILD or MODERATE swelling (e.g., can't move joint normally, can't do usual activities) (Exceptions: Itchy, localized swelling; swelling is chronic.)  Protocols used: Knee Swelling-A-AH

## 2023-08-01 ENCOUNTER — Ambulatory Visit (INDEPENDENT_AMBULATORY_CARE_PROVIDER_SITE_OTHER): Admitting: Family Medicine

## 2023-08-01 ENCOUNTER — Ambulatory Visit
Admission: RE | Admit: 2023-08-01 | Discharge: 2023-08-01 | Disposition: A | Discharge status: Home / Self Care | Source: Ambulatory Visit | Attending: Family Medicine | Admitting: Family Medicine

## 2023-08-01 ENCOUNTER — Encounter: Payer: Self-pay | Admitting: Family Medicine

## 2023-08-01 VITALS — BP 120/80 | HR 50 | Wt 200.2 lb

## 2023-08-01 DIAGNOSIS — M25562 Pain in left knee: Secondary | ICD-10-CM

## 2023-08-01 DIAGNOSIS — M25462 Effusion, left knee: Secondary | ICD-10-CM | POA: Diagnosis not present

## 2023-08-01 NOTE — Patient Instructions (Signed)
 Take 2 Aleve twice per day for the next 2 weeks.  We will get an x-ray to see what looks like

## 2023-08-01 NOTE — Progress Notes (Signed)
   Subjective:    Patient ID: Benjamin Macias, male    DOB: 04-12-1960, 63 y.o.   MRN: [id number]  HPI He is here for evaluation of left knee pain.  He states that he noted the discomfort and swelling started on Tuesday.  He has had no history of injury or overuse.  He does have remote history of injury dating back to 1993 and apparently did have meniscal surgery done.  This was a Workmen's Comp. related injury.  He does also complain of some slight clicking sensation that actually predated the most recent fusion.   Review of Systems     Objective:    Physical Exam Left knee exam does show minimal effusion.  Slight clicking was noted with the knee being fully extended.  Anterior drawer negative.  Merrick McMurray's testing negative.  Medial and lateral lateral ligaments intact.       Assessment & Plan:  Left knee pain, unspecified chronicity - Plan: DG Knee Complete 4 Views Left, Ambulatory referral to Physical Therapy  Effusion of bursa of left knee Discussed the steps taken at this point would be to use an anti-inflammatory to see if it will quiet down.  Also discussed physical therapy since he is having a clicking sensation and this is a long-term issue.  Will also get an x-ray.  Discussed the possible use down the road of steroids and other synthetic lubricants.

## 2023-08-10 ENCOUNTER — Encounter: Payer: Self-pay | Admitting: Family Medicine

## 2023-08-28 ENCOUNTER — Encounter: Payer: Self-pay | Admitting: Physical Therapy

## 2023-08-28 ENCOUNTER — Ambulatory Visit: Admitting: Physical Therapy

## 2023-08-28 DIAGNOSIS — M25562 Pain in left knee: Secondary | ICD-10-CM

## 2023-08-28 DIAGNOSIS — M6281 Muscle weakness (generalized): Secondary | ICD-10-CM | POA: Diagnosis not present

## 2023-08-28 NOTE — Therapy (Signed)
 OUTPATIENT PHYSICAL THERAPY LOWER EXTREMITY EVALUATION   Patient Name: Benjamin Macias MRN: [id number] DOB:Mar 10, 1961, 63 y.o., male Today's Date: 08/28/2023  END OF SESSION:  PT End of Session - 08/28/23 1132     Visit Number 1    Number of Visits 4    Date for PT Re-Evaluation 10/16/23    Authorization Type [employer]    PT Start Time 0931    PT Stop Time 1005    PT Time Calculation (min) 34 min    Activity Tolerance Patient tolerated treatment well    Behavior During Therapy [facility] for tasks assessed/performed             Past Medical History:  Diagnosis Date   Atherosclerosis 07/15/2022   Mild Aortic   Left thyroid  nodule    Past Surgical History:  Procedure Laterality Date   COLONOSCOPY  2011   KNEE ARTHROSCOPY W/ MENISCECTOMY  1994   Patient Active Problem List   Diagnosis Date Noted   Aortic atherosclerosis (HCC) 09/09/2022   Ptosis of left eyelid 06/07/2021   Chronic low back pain without sciatica 05/29/2020   Former smoker 05/23/2019   Essential hypertension 05/23/2019   Lactose intolerance 01/19/2012    PCP: Watson Hacking, MD  REFERRING PROVIDER: Watson Hacking, MD  REFERRING DIAG: [phone] (ICD-10-CM) - Left knee pain, unspecified chronicity   THERAPY DIAG:  Acute pain of left knee  Muscle weakness (generalized)  Rationale for Evaluation and Treatment: Rehabilitation  ONSET DATE: 07/27/2023    SUBJECTIVE STATEMENT: He has history of left knee meniscus repair in 1993. He started on 07/27/2023 he began to have knee pain & swelling.  No known etiology.    PERTINENT HISTORY: 1993 left knee meniscus repair, chronic LBP, HTN  PAIN:  NPRS scale: at rest 0/10 kneeling or crawling 2-3/10 Pain location:  left knee more anterior lateral  Pain description:  sharp Aggravating factors: kneeling or crawling,  a couple of weeks ago edema & trying to bend knee Relieving factors:   took some NSAIDs early,  limits time on knees.   PRECAUTIONS: None  WEIGHT BEARING  RESTRICTIONS: No  FALLS:  Has patient fallen in last 6 months? No  LIVING ENVIRONMENT: Lives with: lives with their spouse Lives in: House  2 Paediatric nurse downstairs,  extra bath upstairs Stairs: Yes: Internal: 14 steps; on left going up and External: 2 steps; on right going up and on left going up  OCCUPATION:   retired from [employer],    PLOF: Independent  PATIENT GOALS:   strengthen his knee  Next MD visit:  OBJECTIVE:  DIAGNOSTIC FINDINGS: 08/01/2023 X-ray shows No evidence of fracture, dislocation, or joint effusion. No evidence of arthropathy or other focal bone abnormality. Soft tissues are unremarkable.  Patient-Specific Activity Scoring Scheme  "0" represents "unable to perform." "10" represents "able to perform at prior level. 0 1 2 3 4 5 6 7 8 9  10 (Date and Score)   Activity Eval     1.   Stairs   9    2.   On floor with grandkids no pain  3    3.   Squating  3   4.    5.    Score 5    Total score = sum of the activity scores/number of activities Minimum detectable change (90%CI) for average score = 2 points Minimum detectable change (90%CI) for single activity score = 3 points  COGNITION: Overall cognitive status: [facility]  SENSATION: WFL  EDEMA:  None noted at evaluation on 08/28/2023 but reports was present a couple of weeks ago.   MUSCLE LENGTH: Hamstrings: Left WFL Iliotibial band: Left WFL and denies pain with palpation  POSTURE: No Significant postural limitations  PALPATION: Patient denies tenderness to palpation on left knee including tendons, ligaments and surrounding muscles.  LOWER EXTREMITY ROM:   ROM Right eval Left eval  Hip flexion    Hip extension    Hip abduction    Hip adduction    Hip internal rotation    Hip external rotation    Knee flexion    Knee extension  LAQ: 0*  Ankle dorsiflexion    Ankle plantarflexion    Ankle inversion    Ankle eversion     (Blank rows = not tested)  LOWER EXTREMITY  MMT:  MMT Right eval Left eval  Hip flexion    Hip extension    Hip abduction    Hip adduction    Hip internal rotation    Hip external rotation    Knee flexion    Knee extension  4/5  Ankle dorsiflexion    Ankle plantarflexion    Ankle inversion    Ankle eversion     (Blank rows = not tested)  LOWER EXTREMITY SPECIAL TESTS:  Knee special tests: Patellafemoral grind test: negative  FUNCTIONAL TESTS:  18 inch chair transfer: Able to arise without upper extremity support  GAIT: Distance walked: 200' Assistive device utilized: None Level of assistance: Complete Independence  Stairs alternating pattern without upper extremity support but PT able to note decreased stance time LLE   TODAY'S TREATMENT                                                                          DATE: 08/28/2023 Therapeutic Exercise: HEP instruction/performance c cues for techniques, handout provided.  Trial set performed of each for comprehension and symptom assessment.  See below for exercise list  PATIENT EDUCATION:  Education details: HEP, POC Person educated: Patient Education method: Explanation, Demonstration, Verbal cues, and Handouts Education comprehension: verbalized understanding, returned demonstration, and verbal cues required  HOME EXERCISE PROGRAM: Access Code: [email or web address] URL: [email or web address] Date: 08/28/2023 Prepared by: Lorie Rook  Exercises - Stand to Sit  - 1 x daily - 5 x weekly - 2 sets - 10 reps - 5 seconds hold - Single Leg Balance with Opposite Leg Star Reach  - 1 x daily - 5 x weekly - 1 sets - 5 reps - Single Leg Knee Extension with Weight Machine  - 1 x daily - 3-5 x weekly - 2-3 sets - 10 reps - 5 seconds hold  ASSESSMENT: CLINICAL IMPRESSION: Patient is a 63 y.o. who comes to clinic with complaints of left knee pain with mobility, strength and movement coordination deficits that impair their ability to perform usual daily and recreational  functional activities without increase difficulty/symptoms at this time.  Patient to benefit from skilled PT services to address impairments and limitations to improve to previous level of function without restriction secondary to condition.   OBJECTIVE IMPAIRMENTS: decreased activity tolerance, decreased strength, and pain.   ACTIVITY LIMITATIONS: bending, squatting, and locomotion level  PARTICIPATION LIMITATIONS: community activity and  playing with grandchildren  PERSONAL FACTORS: 3+ comorbidities: see PMH are also affecting patient's functional outcome.   REHAB POTENTIAL: Good  CLINICAL DECISION MAKING: Stable/uncomplicated  EVALUATION COMPLEXITY: Low   GOALS: Goals reviewed with patient? Yes  LONG TERM GOALS: (target dates for all long term goals 10/16/2023  )   1. Patient will demonstrate/report pain at worst less than or equal to 2/10 to facilitate minimal limitation in daily activity secondary to pain symptoms. Baseline: See objective data Goal status: Initial   2. Patient will demonstrate independent use of home exercise program to facilitate ability to maintain/progress functional gains from skilled physical therapy services. Baseline: See objective data Goal status: Initial  3.  Patient reports Patient-Specific Activity Score improved the average to 8 to indicate improvement in functional activities.  Baseline: SEE OBJECTIVE DATA Goal status: INITIAL   4.  Patient will demonstrate left LE MMT 5/5 throughout to faciltiate usual transfers, stairs, squatting at [facility] for daily life.  Baseline: See objective data Goal status: Initial  PLAN:  PT FREQUENCY:  1x/week every 1-2 weeks as needed  PT DURATION: 4 visits total  PLANNED INTERVENTIONS: 97164- PT Re-evaluation, 97750- Physical Performance Testing, 97110-Therapeutic exercises, 97530- Therapeutic activity, [id number]- Neuromuscular re-education, 97535- Self Care, 16109- Manual therapy, [id number]- Gait training, [phone]-  Ionotophoresis 4mg /ml Dexamethasone, Stair training, and Taping  PLAN FOR NEXT SESSION: Review & Update HEP.   Assess pain and if resolved discharge.    Lorie Rook, PT 08/28/2023, 11:54 AM

## 2023-09-18 ENCOUNTER — Encounter: Payer: Self-pay | Admitting: Physical Therapy

## 2023-09-18 ENCOUNTER — Ambulatory Visit: Admitting: Physical Therapy

## 2023-09-18 DIAGNOSIS — M6281 Muscle weakness (generalized): Secondary | ICD-10-CM | POA: Diagnosis not present

## 2023-09-18 DIAGNOSIS — M25562 Pain in left knee: Secondary | ICD-10-CM

## 2023-09-18 NOTE — Therapy (Signed)
 OUTPATIENT PHYSICAL THERAPY LOWER EXTREMITY TREATMENT   Patient Name: Benjamin Macias MRN: [id number] DOB:1960/09/23, 63 y.o., male Today's Date: 09/18/2023  END OF SESSION:  PT End of Session - 09/18/23 0939     Visit Number 2    Number of Visits 4    Date for PT Re-Evaluation 10/16/23    Authorization Type U[employer]    PT Start Time 0936    PT Stop Time 1010    PT Time Calculation (min) 34 min    Activity Tolerance Patient tolerated treatment well    Behavior During Therapy [facility] for tasks assessed/performed           Past Medical History:  Diagnosis Date   Atherosclerosis 07/15/2022   Mild Aortic   Left thyroid  nodule    Past Surgical History:  Procedure Laterality Date   COLONOSCOPY  2011   KNEE ARTHROSCOPY W/ MENISCECTOMY  1994   Patient Active Problem List   Diagnosis Date Noted   Aortic atherosclerosis (HCC) 09/09/2022   Ptosis of left eyelid 06/07/2021   Chronic low back pain without sciatica 05/29/2020   Former smoker 05/23/2019   Essential hypertension 05/23/2019   Lactose intolerance 01/19/2012    PCP: Joyce Norleen BROCKS, MD  REFERRING PROVIDER: Joyce Norleen BROCKS, MD  REFERRING DIAG: [phone] (ICD-10-CM) - Left knee pain, unspecified chronicity   THERAPY DIAG:  Acute pain of left knee  Muscle weakness (generalized)  Rationale for Evaluation and Treatment: Rehabilitation  ONSET DATE: 07/27/2023    SUBJECTIVE STATEMENT: He has been on Elliptical 3-4 days/wk, stationary bike 3-4 days/wk and regular bike 1-2 days/wk.  He has gone to [facility] using knee ext machine as PT advised.  He reports no knee pain.    PERTINENT HISTORY: 1993 left knee meniscus repair, chronic LBP, HTN  PAIN:  NPRS scale: at rest 0/10 kneeling or crawling 1/10 Pain location:  left knee more anterior lateral  Pain description:  sharp Aggravating factors: kneeling or crawling,  a couple of weeks ago edema & trying to bend knee Relieving factors:   took some NSAIDs early,  limits time on  knees.   PRECAUTIONS: None  WEIGHT BEARING RESTRICTIONS: No  FALLS:  Has patient fallen in last 6 months? No  LIVING ENVIRONMENT: Lives with: lives with their spouse Lives in: House  2 Paediatric nurse downstairs,  extra bath upstairs Stairs: Yes: Internal: 14 steps; on left going up and External: 2 steps; on right going up and on left going up  OCCUPATION:   retired from [employer],    PLOF: Independent  PATIENT GOALS:   strengthen his knee  Next MD visit:  OBJECTIVE:  DIAGNOSTIC FINDINGS: 08/01/2023 X-ray shows No evidence of fracture, dislocation, or joint effusion. No evidence of arthropathy or other focal bone abnormality. Soft tissues are unremarkable.  Patient-Specific Activity Scoring Scheme  0 represents "unable to perform." 10 represents "able to perform at prior level. 0 1 2 3 4 5 6 7 8 9  10 (Date and Score)   Activity Eval     1.   Stairs   9    2.   On floor with grandkids no pain  3    3.   Squating  3   4.    5.    Score 5    Total score = sum of the activity scores/number of activities Minimum detectable change (90%CI) for average score = 2 points Minimum detectable change (90%CI) for single activity score = 3 points  COGNITION: Overall cognitive status: WFL    SENSATION: WFL  EDEMA:  None noted at evaluation on 08/28/2023 but reports was present a couple of weeks ago.   MUSCLE LENGTH: Hamstrings: Left WFL Iliotibial band: Left WFL and denies pain with palpation  POSTURE: No Significant postural limitations  PALPATION: Patient denies tenderness to palpation on left knee including tendons, ligaments and surrounding muscles.  LOWER EXTREMITY ROM:   ROM Left eval  Hip flexion   Hip extension   Hip abduction   Hip adduction   Hip internal rotation   Hip external rotation   Knee flexion   Knee extension LAQ: 0*  Ankle dorsiflexion   Ankle plantarflexion   Ankle inversion   Ankle eversion    (Blank rows = not  tested)  LOWER EXTREMITY MMT:  MMT Left eval  Hip flexion   Hip extension   Hip abduction   Hip adduction   Hip internal rotation   Hip external rotation   Knee flexion   Knee extension 4/5  Ankle dorsiflexion   Ankle plantarflexion   Ankle inversion   Ankle eversion    (Blank rows = not tested)  LOWER EXTREMITY SPECIAL TESTS:  Knee special tests: Patellafemoral grind test: negative  FUNCTIONAL TESTS:  18 inch chair transfer: Able to arise without upper extremity support  GAIT: Distance walked: 200' Assistive device utilized: None Level of assistance: Complete Independence  Stairs alternating pattern without upper extremity support but PT able to note decreased stance time LLE   TODAY'S TREATMENT                                                                          DATE: 09/18/2023 Therapeutic Exercise: Stationary bike seat 7 level 5 for 5 min as warm-up taking subjective. Gastroc stretch step heel depression 20 sec hold 3 reps BLEs Quad stretch standing knee flexion  20 sec hold 3 reps BLEs Hamstring stretch standing foot on step  20 sec hold 3 reps BLEs Quadruped cat cow 5 reps Quadruped alternating UE lift & reach 10 reps Quadruped alternating LE lift & reach 10 reps Quadruped bird dog 5 reps  Squat at sink 15 reps PT demo & verbal cues on technique.  Lunge bw 2 chairs for BUE support 10 reps with ea LE forward position.  PT demo & verbal cues prior to each exercise with HO at end of session.  Pt verbalized understanding of updated HEP after performing in clinic.    TREATMENT                                                                          DATE: 08/28/2023 Therapeutic Exercise: HEP instruction/performance c cues for techniques, handout provided.  Trial set performed of each for comprehension and symptom assessment.  See below for exercise list  PATIENT EDUCATION:  Education details: HEP, POC Person educated: Patient Education method: Explanation,  Demonstration, Verbal cues, and Handouts Education comprehension: verbalized understanding, returned  demonstration, and verbal cues required  HOME EXERCISE PROGRAM: Access Code: [email or web address] URL: [email or web address] Date: 09/18/2023 Prepared by: Grayce Spatz  Exercises - Stand to Sit  - 1 x daily - 5 x weekly - 2 sets - 10 reps - 5 seconds hold - Single Leg Balance with Opposite Leg Star Reach  - 1 x daily - 5 x weekly - 1 sets - 5 reps - Single Leg Knee Extension with Weight Machine  - 1 x daily - 3-5 x weekly - 2-3 sets - 10 reps - 5 seconds hold - Gastroc Stretch on Step  - 1-2 x daily - 7 x weekly - 1 sets - 3 reps - 20-30 seconds hold - Standing Hamstring Stretch with Step  - 1-2 x daily - 7 x weekly - 1 sets - 3 reps - 20-30 seconds hold - Standing Quad Stretch with Towel and Arm Support  - 1-2 x daily - 7 x weekly - 1 sets - 3 reps - 20-30 seconds hold - Cat Cow  - 1 x daily - 3-5 x weekly - 1 sets - 10 reps - 5 seconds hold - Quadruped Alternating Arm Lift  - 1 x daily - 3-5 x weekly - 1 sets - 10 reps - 5 seconds hold - Quadruped Alternating Leg Extensions  - 1 x daily - 3-5 x weekly - 1 sets - 10 reps - 5 seconds hold - Bird Dog  - 1 x daily - 3-5 x weekly - 1 sets - 10 reps - 5 seconds hold - Squat with Chair Support  - 1 x daily - 5 x weekly - 1-2 sets - 10 reps - 5 seconds hold - lunge between 2 chairs  - 1 x daily - 5 x weekly - 1-2 sets - 10 reps - 5 seconds hold  ASSESSMENT: CLINICAL IMPRESSION: Patient is reporting less knee pain but back is now limiting activities. He appears to understand updated HEP.    OBJECTIVE IMPAIRMENTS: decreased activity tolerance, decreased strength, and pain.   ACTIVITY LIMITATIONS: bending, squatting, and locomotion level  PARTICIPATION LIMITATIONS: community activity and playing with grandchildren  PERSONAL FACTORS: 3+ comorbidities: see PMH are also affecting patient's functional outcome.   REHAB POTENTIAL:  Good  CLINICAL DECISION MAKING: Stable/uncomplicated  EVALUATION COMPLEXITY: Low   GOALS: Goals reviewed with patient? Yes  LONG TERM GOALS: (target dates for all long term goals 10/16/2023  )   1. Patient will demonstrate/report pain at worst less than or equal to 2/10 to facilitate minimal limitation in daily activity secondary to pain symptoms. Baseline: See objective data Goal status:   Ongoing    09/18/2023   2. Patient will demonstrate independent use of home exercise program to facilitate ability to maintain/progress functional gains from skilled physical therapy services. Baseline: See objective data Goal status: Ongoing    09/18/2023  3.  Patient reports Patient-Specific Activity Score improved the average to 8 to indicate improvement in functional activities.  Baseline: SEE OBJECTIVE DATA Goal status: Ongoing    09/18/2023   4.  Patient will demonstrate left LE MMT 5/5 throughout to faciltiate usual transfers, stairs, squatting at [facility] for daily life.  Baseline: See objective data Goal status: Ongoing    09/18/2023  PLAN:  PT FREQUENCY:  1x/week every 1-2 weeks as needed  PT DURATION: 4 visits total  PLANNED INTERVENTIONS: 97164- PT Re-evaluation, 97750- Physical Performance Testing, 97110-Therapeutic exercises, 97530- Therapeutic activity, [id number]- Neuromuscular re-education, 97535- Self Care, 02859- Manual therapy, [id number]- Gait training,  02966- Ionotophoresis 4mg /ml Dexamethasone, Stair training, and Taping  PLAN FOR NEXT SESSION: check Update HEP.   Assess pain. Assess LTGs.    Grayce Spatz, PT, DPT 09/18/2023, 10:13 AM

## 2023-10-10 ENCOUNTER — Encounter: Payer: Self-pay | Admitting: Physical Therapy

## 2023-10-10 ENCOUNTER — Ambulatory Visit: Admitting: Physical Therapy

## 2023-10-10 DIAGNOSIS — M25562 Pain in left knee: Secondary | ICD-10-CM

## 2023-10-10 DIAGNOSIS — M6281 Muscle weakness (generalized): Secondary | ICD-10-CM

## 2023-10-10 NOTE — Therapy (Signed)
 OUTPATIENT PHYSICAL THERAPY LOWER EXTREMITY TREATMENT & DISCHARGE SUMMARY   Patient Name: Benjamin Macias MRN: [id number] DOB:03-14-1961, 63 y.o., male Today's Date: 10/10/2023  PHYSICAL THERAPY DISCHARGE SUMMARY  Visits from Start of Care: 3  Current functional level related to goals / functional outcomes: See below   Remaining deficits: See below   Education / Equipment: He appears to understand of ongoing HEP.     Patient agrees to discharge. Patient goals were met. Patient is being discharged due to meeting the stated rehab goals.   END OF SESSION:  PT End of Session - 10/10/23 1515     Visit Number 3    Number of Visits 4    Date for PT Re-Evaluation 10/16/23    Authorization Type UHC    PT Start Time 1515    Activity Tolerance Patient tolerated treatment well    Behavior During Therapy [facility] for tasks assessed/performed            Past Medical History:  Diagnosis Date   Atherosclerosis 07/15/2022   Mild Aortic   Left thyroid  nodule    Past Surgical History:  Procedure Laterality Date   COLONOSCOPY  2011   KNEE ARTHROSCOPY W/ MENISCECTOMY  1994   Patient Active Problem List   Diagnosis Date Noted   Aortic atherosclerosis (HCC) 09/09/2022   Ptosis of left eyelid 06/07/2021   Chronic low back pain without sciatica 05/29/2020   Former smoker 05/23/2019   Essential hypertension 05/23/2019   Lactose intolerance 01/19/2012    PCP: Joyce Norleen BROCKS, MD  REFERRING PROVIDER: Joyce Norleen BROCKS, MD  REFERRING DIAG: [phone] (ICD-10-CM) - Left knee pain, unspecified chronicity   THERAPY DIAG:  Acute pain of left knee  Muscle weakness (generalized)  Rationale for Evaluation and Treatment: Rehabilitation  ONSET DATE: 07/27/2023    SUBJECTIVE STATEMENT: His knee is better with no pain.    PERTINENT HISTORY: 1993 left knee meniscus repair, chronic LBP, HTN  PAIN:  NPRS scale: at rest  0/10 kneeling or crawling 0/10 Pain location:  left knee more anterior  lateral  Pain description:  sharp Aggravating factors: kneeling or crawling,  a couple of weeks ago edema & trying to bend knee Relieving factors:   took some NSAIDs early,  limits time on knees.   PRECAUTIONS: None  WEIGHT BEARING RESTRICTIONS: No  FALLS:  Has patient fallen in last 6 months? No  LIVING ENVIRONMENT: Lives with: lives with their spouse Lives in: House  2 Paediatric nurse downstairs,  extra bath upstairs Stairs: Yes: Internal: 14 steps; on left going up and External: 2 steps; on right going up and on left going up  OCCUPATION:   retired from [employer],    PLOF: Independent  PATIENT GOALS:   strengthen his knee  Next MD visit:  OBJECTIVE:  DIAGNOSTIC FINDINGS: 08/01/2023 X-ray shows No evidence of fracture, dislocation, or joint effusion. No evidence of arthropathy or other focal bone abnormality. Soft tissues are unremarkable.  Patient-Specific Activity Scoring Scheme  0 represents "unable to perform." 10 represents "able to perform at prior level. 0 1 2 3 4 5 6 7 8 9  10 (Date and Score)   Activity Eval  10/10/23   1.   Stairs   9  10  2.   On floor with grandkids no pain  3  10  3.   Squating  3 10  4.    5.    Score 5 10   Total score = sum of  the activity scores/number of activities Minimum detectable change (90%CI) for average score = 2 points Minimum detectable change (90%CI) for single activity score = 3 points  COGNITION: Overall cognitive status: WFL    SENSATION: WFL  EDEMA:  None noted at evaluation on 08/28/2023 but reports was present a couple of weeks ago.   MUSCLE LENGTH: Hamstrings: Left WFL Iliotibial band: Left WFL and denies pain with palpation  POSTURE: No Significant postural limitations  PALPATION: Patient denies tenderness to palpation on left knee including tendons, ligaments and surrounding muscles.  LOWER EXTREMITY ROM:   ROM Left eval  Hip flexion   Hip extension   Hip abduction   Hip adduction    Hip internal rotation   Hip external rotation   Knee flexion   Knee extension LAQ: 0*  Ankle dorsiflexion   Ankle plantarflexion   Ankle inversion   Ankle eversion    (Blank rows = not tested)  LOWER EXTREMITY MMT:  MMT Left eval left 10/10/23 Right 10/10/23  Hip flexion     Hip extension     Hip abduction     Hip adduction     Hip internal rotation     Hip external rotation     Knee flexion     Knee extension 4/5 HH dynameter 77.8#, 82.6# HH dynameter 77.1#, 71.8# RLE:LLE 93%  Ankle dorsiflexion     Ankle plantarflexion     Ankle inversion     Ankle eversion      (Blank rows = not tested)  LOWER EXTREMITY SPECIAL TESTS:  Knee special tests: Patellafemoral grind test: negative  FUNCTIONAL TESTS:  18 inch chair transfer: Able to arise without upper extremity support  GAIT: Distance walked: 200' Assistive device utilized: None Level of assistance: Complete Independence  Stairs alternating pattern without upper extremity support but PT able to note decreased stance time LLE   TODAY'S TREATMENT                                                                          DATE: 10/10/2023 Therapeutic Exercise: Stationary bike seat 7 level 5 for 5 min as warm-up taking subjective. PT reviewed HEP with recommendation for ongoing >/= 3 times per week maintaining flexibility, strength, muscle endurance and balance.  Patient verbalized understanding. See objective data for MMT. Patient reports difficulty crossing left knee over right knee ever since his original arthroscopic surgery years ago.  PT demo and verbal cues on hip rotation stretch modified using footstool.  Patient verbalized and return demonstration understanding.     TREATMENT                                                                          DATE: 09/18/2023 Therapeutic Exercise: Stationary bike seat 7 level 5 for 5 min as warm-up taking subjective. Gastroc stretch step heel depression 20 sec hold 3  reps BLEs Quad stretch standing knee flexion  20 sec hold 3 reps BLEs Hamstring  stretch standing foot on step  20 sec hold 3 reps BLEs Quadruped cat cow 5 reps Quadruped alternating UE lift & reach 10 reps Quadruped alternating LE lift & reach 10 reps Quadruped bird dog 5 reps  Squat at sink 15 reps PT demo & verbal cues on technique.  Lunge bw 2 chairs for BUE support 10 reps with ea LE forward position.  PT demo & verbal cues prior to each exercise with HO at end of session.  Pt verbalized understanding of updated HEP after performing in clinic.    TREATMENT                                                                          DATE: 08/28/2023 Therapeutic Exercise: HEP instruction/performance c cues for techniques, handout provided.  Trial set performed of each for comprehension and symptom assessment.  See below for exercise list  PATIENT EDUCATION:  Education details: HEP, POC Person educated: Patient Education method: Explanation, Demonstration, Verbal cues, and Handouts Education comprehension: verbalized understanding, returned demonstration, and verbal cues required  HOME EXERCISE PROGRAM: Access Code: [email or web address] URL: [email or web address] Date: 09/18/2023 Prepared by: Grayce Spatz  Exercises - Stand to Sit  - 1 x daily - 5 x weekly - 2 sets - 10 reps - 5 seconds hold - Single Leg Balance with Opposite Leg Star Reach  - 1 x daily - 5 x weekly - 1 sets - 5 reps - Single Leg Knee Extension with Weight Machine  - 1 x daily - 3-5 x weekly - 2-3 sets - 10 reps - 5 seconds hold - Gastroc Stretch on Step  - 1-2 x daily - 7 x weekly - 1 sets - 3 reps - 20-30 seconds hold - Standing Hamstring Stretch with Step  - 1-2 x daily - 7 x weekly - 1 sets - 3 reps - 20-30 seconds hold - Standing Quad Stretch with Towel and Arm Support  - 1-2 x daily - 7 x weekly - 1 sets - 3 reps - 20-30 seconds hold - Cat Cow  - 1 x daily - 3-5 x weekly - 1 sets - 10 reps - 5 seconds hold -  Quadruped Alternating Arm Lift  - 1 x daily - 3-5 x weekly - 1 sets - 10 reps - 5 seconds hold - Quadruped Alternating Leg Extensions  - 1 x daily - 3-5 x weekly - 1 sets - 10 reps - 5 seconds hold - Bird Dog  - 1 x daily - 3-5 x weekly - 1 sets - 10 reps - 5 seconds hold - Squat with Chair Support  - 1 x daily - 5 x weekly - 1-2 sets - 10 reps - 5 seconds hold - lunge between 2 chairs  - 1 x daily - 5 x weekly - 1-2 sets - 10 reps - 5 seconds hold  ASSESSMENT: CLINICAL IMPRESSION: Patient patient met all LTG's.  Patient appears to be functioning with no knee pain.  He appears understand ongoing HEP.  OBJECTIVE IMPAIRMENTS: decreased activity tolerance, decreased strength, and pain.   ACTIVITY LIMITATIONS: bending, squatting, and locomotion level  PARTICIPATION LIMITATIONS: community activity and playing with grandchildren  PERSONAL FACTORS: 3+ comorbidities: see PMH are also affecting  patient's functional outcome.   REHAB POTENTIAL: Good  CLINICAL DECISION MAKING: Stable/uncomplicated  EVALUATION COMPLEXITY: Low   GOALS: Goals reviewed with patient? Yes  LONG TERM GOALS: (target dates for all long term goals 10/16/2023  )   1. Patient will demonstrate/report pain at worst less than or equal to 2/10 to facilitate minimal limitation in daily activity secondary to pain symptoms. Baseline: See objective data Goal status:   MET  10/10/2023   2. Patient will demonstrate independent use of home exercise program to facilitate ability to maintain/progress functional gains from skilled physical therapy services. Baseline: See objective data Goal status: MET  10/10/2023  3.  Patient reports Patient-Specific Activity Score improved the average to 8 to indicate improvement in functional activities.  Baseline: SEE OBJECTIVE DATA Goal status: MET   10/10/2023   4.  Patient will demonstrate left LE MMT 5/5 throughout to faciltiate usual transfers, stairs, squatting at [facility] for daily life.   Baseline: See objective data Goal status: MET   10/10/2023  PLAN:  PT FREQUENCY:  1x/week every 1-2 weeks as needed  PT DURATION: 4 visits total  PLANNED INTERVENTIONS: 97164- PT Re-evaluation, 97750- Physical Performance Testing, 97110-Therapeutic exercises, 97530- Therapeutic activity, [id number]- Neuromuscular re-education, 97535- Self Care, 02859- Manual therapy, [phone]- Gait training, [phone]- Ionotophoresis 4mg /ml Dexamethasone, Stair training, and Taping  PLAN FOR NEXT SESSION: Discharge PT   Grayce Spatz, PT, DPT 10/10/2023, 3:16 PM

## 2023-10-12 ENCOUNTER — Ambulatory Visit: Payer: Self-pay

## 2023-10-12 NOTE — Telephone Encounter (Signed)
 FYI Only or Action Required?: Action required by provider: request for appointment.  Patient was last seen in primary care on 08/01/2023 by Joyce Norleen BROCKS, MD.  Called Nurse Triage reporting Back Pain.  Symptoms began several years ago.  Interventions attempted: OTC medications: tylenol.  Symptoms are: gradually worsening.  Triage Disposition: See PCP When Office is Open (Within 3 Days)  Patient/caregiver understands and will follow disposition?: YesCopied from CRM [id number]. Topic: Clinical - Red Word Triage >> Oct 12, 2023 11:44 AM Silvana PARAS wrote: Red Word that prompted transfer to Nurse Triage: Right side of back pain. Warm transfer to nurse for about last few years and getting worse. Reason for Disposition  [1] MODERATE back pain (e.g., interferes with normal activities) AND [2] present > 3 days  Answer Assessment - Initial Assessment Questions 1. ONSET: When did the pain begin? (e.g., minutes, hours, days)     Several years 2. LOCATION: Where does it hurt? (upper, mid or lower back)     Right side of back- right hip to rib care 3. SEVERITY: How bad is the pain?  (e.g., Scale 1-10; mild, moderate, or severe)     Discomfort-4 4. PATTERN: Is the pain constant? (e.g., yes, no; constant, intermittent)      Comes and goes 5. RADIATION: Does the pain shoot into your legs or somewhere else?     Ribs to right hip 6. CAUSE:  What do you think is causing the back pain?      Arthritis  7. BACK OVERUSE:  Any recent lifting of heavy objects, strenuous work or exercise?     Na  8. MEDICINES: What have you taken so far for the pain? (e.g., nothing, acetaminophen, NSAIDS)     Tylenol  9. NEUROLOGIC SYMPTOMS: Do you have any weakness, numbness, or problems with bowel/bladder control?     denies 10. OTHER SYMPTOMS: Do you have any other symptoms? (e.g., fever, abdomen pain, burning with urination, blood in urine)       denies   Pain has increased over time in last 2  years. Pt has had xray that showed some arthritis .  Pt still exercises regularly. Pt has been in PT lately and strength test was done and left side is stronger  Protocols used: Back Pain-A-AH

## 2023-10-13 ENCOUNTER — Ambulatory Visit: Admitting: Medical

## 2023-10-13 ENCOUNTER — Ambulatory Visit
Admission: RE | Admit: 2023-10-13 | Discharge: 2023-10-13 | Disposition: A | Discharge status: Home / Self Care | Source: Ambulatory Visit | Attending: Medical | Admitting: Medical

## 2023-10-13 VITALS — BP 128/70 | HR 65 | Temp 97.9°F | Wt 201.8 lb

## 2023-10-13 DIAGNOSIS — M545 Low back pain, unspecified: Secondary | ICD-10-CM

## 2023-10-13 DIAGNOSIS — M62830 Muscle spasm of back: Secondary | ICD-10-CM

## 2023-10-13 DIAGNOSIS — M461 Sacroiliitis, not elsewhere classified: Secondary | ICD-10-CM | POA: Diagnosis not present

## 2023-10-13 DIAGNOSIS — G8929 Other chronic pain: Secondary | ICD-10-CM | POA: Diagnosis not present

## 2023-10-13 MED ORDER — TIZANIDINE HCL 4 MG PO TABS: 4.0000 mg | ORAL_TABLET | Freq: Two times a day (BID) | ORAL | 0 refills | Status: DC | PRN

## 2023-10-13 MED ORDER — NAPROXEN 500 MG PO TABS: 500.0000 mg | ORAL_TABLET | Freq: Two times a day (BID) | ORAL | 0 refills | Status: AC

## 2023-10-13 NOTE — Progress Notes (Signed)
 Subjective:  Benjamin Macias is a 63 y.o. male who presents for Chief Complaint  Patient presents with   Acute Visit    Right sided Back pain for a while, reaching to put seatbeat on can feel a pull, walking or moving and feel the pain, some pain goes into his pelvis area and hip area  Lifting leg makes its hurt.      Here for some ongoing back pain, chronic back pain.  Flares up from time to time.   Currently notes pains in low to mid back, jarring type pain, pain radiates down to buttocks as well.  No pain radiating down legs. Feels worse pain in low back putting on pants or underwear.  Retired.   Exercises regularly.  Does some strength straining, but more cardio  Using some tylenol arthritis.     No recent injury, trauma or fall.  No numbness or tingling.  No fever.  No problems with bowels or bladder.  No other aggravating or relieving factors.    No other c/o.  Past Medical History:  Diagnosis Date   Atherosclerosis 07/15/2022   Mild Aortic   Left thyroid  nodule    Current Outpatient Medications on File Prior to Visit  Medication Sig Dispense Refill   amLODipine  (NORVASC ) 5 MG tablet Take 1 tablet (5 mg total) by mouth daily. 90 tablet 3   atorvastatin  (LIPITOR) 20 MG tablet Take 1 tablet (20 mg total) by mouth daily. 90 tablet 3   clotrimazole -betamethasone  (LOTRISONE ) cream Apply 1 Application topically daily. 45 g 0   Multiple Vitamins-Minerals (MENS 50+ MULTI VITAMIN/MIN PO) Take by mouth.     triamcinolone  cream (KENALOG ) 0.1 % Apply 1 Application topically 2 (two) times daily. 453 g 0   No current facility-administered medications on file prior to visit.     The following portions of the patient's history were reviewed and updated as appropriate: allergies, current medications, past family history, past medical history, past social history, past surgical history and problem list.  ROS Otherwise as in subjective above  Objective: BP 128/70   Pulse 65   Temp  97.9 F (36.6 C)   Wt 201 lb 12.8 oz (91.5 kg)   SpO2 99%   BMI 28.96 kg/m   General appearance: alert, no distress, well developed, well nourished Tender right sided SI area as well as lumbar spine paraspinal region and spasm.  Range of motion of back is normal.  No swelling or deformity. Legs nontender normal range of motion Legs neurovascularly intact   Assessment: Encounter Diagnoses  Name Primary?   Chronic right-sided low back pain without sciatica Yes   Sacroiliitis (HCC)    Back spasm      Plan: Your symptoms and exam today suggest some sacroiliitis and lumbar muscle strain and spasm  Recommendations Begin Naprosyn 500 mg twice daily for pain and inflammation in the low back.  Use this for the next 5 days then after that use as needed for pain and inflammation You can begin tizanidine muscle relaxer.  You can use this up to twice daily as needed for spasm and tension in the muscles.  Caution this can cause drowsiness Try to do stretches every day head to toe stretching routine Consider getting massage therapy for the low back You can also use over-the-counter Tylenol as needed alternating with the Naprosyn by at least 4 hours For the next 4 to 5 days avoid any heavy lifting.  But in general you want to be active, stretching,  using aerobic and weightbearing exercise regularly Short-term you can do cold therapy or alternate with heat therapy to the low back Consider going for updated x-ray    Please go to [facility] for your low back xray.   Their hours are 8am - 4:30 pm Monday - Friday.  Take your insurance card with you.  [facility] [phone]  [address] [place], [place] [address]  [name] was seen today for acute visit.  Diagnoses and all orders for this visit:  Chronic right-sided low back pain without sciatica -     DG Lumbar Spine Complete; Future  Sacroiliitis (HCC) -     DG Lumbar Spine Complete; Future  Back spasm -     DG  Lumbar Spine Complete; Future  Other orders -     tiZANidine (ZANAFLEX) 4 MG tablet; Take 1 tablet (4 mg total) by mouth 2 (two) times daily as needed for muscle spasms. -     naproxen (NAPROSYN) 500 MG tablet; Take 1 tablet (500 mg total) by mouth 2 (two) times daily with a meal.    Follow up: pending xray

## 2023-10-13 NOTE — Patient Instructions (Signed)
 Your symptoms and exam today suggest some sacroiliitis and lumbar muscle strain and spasm  Recommendations Begin Naprosyn 500 mg twice daily for pain and inflammation in the low back.  Use this for the next 5 days then after that use as needed for pain and inflammation You can begin tizanidine muscle relaxer.  You can use this up to twice daily as needed for spasm and tension in the muscles.  Caution this can cause drowsiness Try to do stretches every day head to toe stretching routine Consider getting massage therapy for the low back You can also use over-the-counter Tylenol as needed alternating with the Naprosyn by at least 4 hours For the next 4 to 5 days avoid any heavy lifting.  But in general you want to be active, stretching, using aerobic and weightbearing exercise regularly Short-term you can do cold therapy or alternate with heat therapy to the low back Consider going for updated x-ray    Please go to [facility] for your low back xray.   Their hours are 8am - 4:30 pm Monday - Friday.  Take your insurance card with you.  [facility] [phone]  [address] [place], [place] [address]

## 2023-10-16 ENCOUNTER — Encounter: Admitting: Physical Therapy

## 2023-10-20 ENCOUNTER — Other Ambulatory Visit: Payer: Self-pay | Admitting: Medical

## 2023-10-23 ENCOUNTER — Ambulatory Visit: Payer: Self-pay | Admitting: Medical

## 2023-10-23 DIAGNOSIS — G8929 Other chronic pain: Secondary | ICD-10-CM

## 2023-10-23 NOTE — Progress Notes (Signed)
Results sent to MyChart

## 2024-01-23 ENCOUNTER — Encounter: Payer: Self-pay | Admitting: Family Medicine

## 2024-01-23 ENCOUNTER — Ambulatory Visit: Admitting: Family Medicine

## 2024-01-23 ENCOUNTER — Other Ambulatory Visit: Payer: Self-pay | Admitting: Family Medicine

## 2024-01-23 VITALS — BP 136/78 | HR 64 | Ht 70.0 in | Wt 202.4 lb

## 2024-01-23 DIAGNOSIS — B86 Scabies: Secondary | ICD-10-CM

## 2024-01-23 DIAGNOSIS — D1722 Benign lipomatous neoplasm of skin and subcutaneous tissue of left arm: Secondary | ICD-10-CM | POA: Diagnosis not present

## 2024-01-23 MED ORDER — PERMETHRIN 5 % EX CREA
1.0000 | TOPICAL_CREAM | Freq: Once | CUTANEOUS | 0 refills | Status: AC
Start: 2024-01-23 — End: 2024-01-23

## 2024-01-23 NOTE — Telephone Encounter (Unsigned)
 Copied from CRM [id number]. Topic: Clinical - Medication Refill >> Jan 23, 2024  5:13 PM Everette C wrote: Medication: permethrin (ELIMITE) 5 % cream  Has the patient contacted their pharmacy? Yes (Agent: If no, request that the patient contact the pharmacy for the refill. If patient does not wish to contact the pharmacy document the reason why and proceed with request.) (Agent: If yes, when and what did the pharmacy advise?)  This is the patient's preferred pharmacy:  [id number] [place], [place] - [address] [place] [address] Phone: [phone] Fax: [phone]  Is this the correct pharmacy for this prescription? Yes If no, delete pharmacy and type the correct one.   Has the prescription been filled recently? Yes  Is the patient out of the medication? Yes  Has the patient been seen for an appointment in the last year OR does the patient have an upcoming appointment? Yes  Can we respond through MyChart? No  Agent: Please be advised that Rx refills may take up to 3 business days. We ask that you follow-up with your pharmacy.

## 2024-01-23 NOTE — Progress Notes (Signed)
   Subjective:    Patient ID: Benjamin Macias, male    DOB: May 08, 1960, 63 y.o.   MRN: [id number]  Discussed the use of AI scribe software for clinical note transcription with the patient, who gave verbal consent to proceed.  History of Present Illness   [name] is a 63 year old male who presents with itchy bumps and lumps on his skin.  He has developed itchy, fine bumps on his hands, arms, and legs over the past three to four weeks. The bumps are particularly itchy, and he has been using cortisone cream, though it is unclear if it has provided relief.  He also reports a lump in his arm, which he noticed while palpating the area. The lump is not painful and is described as just 'there'.  He has not experienced any recent changes in activities or environment that could be related to his symptoms.           Review of Systems     Objective:    Physical Exam Exam of the left triceps area shows a 3 cm round smooth movable lesion that with flexion of the triceps does not change in character. Exam of the skin especially on the hands does show multiple very small lesions in the interspace between the fingers.     Assessment & Plan:  Assessment and Plan       Scabies - Plan: permethrin (ELIMITE) 5 % cream  Lipoma of left upper extremity  I gave information on both problems in the AVS and instructions on proper use of the Elimite.  If he continues have difficulty and said that I can refer him to dermatology.  Explained that the lipoma is a benign issue and of no major concern.

## 2024-01-23 NOTE — Patient Instructions (Addendum)
 Lipoma  A lipoma is a noncancerous (benign) tumor that is made up of fat cells. This is a very common type of soft-tissue growth. Lipomas are usually found under the skin (subcutaneous). They may occur in any tissue of the body that contains fat. Common areas for lipomas to appear include the back, arms, shoulders, buttocks, and thighs. Lipomas grow slowly, and they are usually painless. Most lipomas do not cause problems and do not require treatment. What are the causes? The cause of this condition is not known. What increases the risk? You are more likely to develop this condition if: You are 82-47 years old. You have a family history of lipomas. What are the signs or symptoms? A lipoma usually appears as a small, round bump under the skin. In most cases, the lump will: Feel soft or rubbery. Not cause pain or other symptoms. However, if a lipoma is located in an area where it pushes on nerves, it can become painful or cause other symptoms. How is this diagnosed? A lipoma can usually be diagnosed with a physical exam. You may also have tests to confirm the diagnosis and to rule out other conditions. Tests may include: Imaging tests, such as a CT scan or an MRI. Removal of a tissue sample to be looked at under a microscope (biopsy). How is this treated? Treatment for this condition depends on the size of the lipoma and whether it is causing any symptoms. For small lipomas that are not causing problems, no treatment is needed. If a lipoma is bigger or it causes problems, surgery may be done to remove the lipoma. Lipomas can also be removed to improve appearance. Most often, the procedure is done after applying a medicine that numbs the area (local anesthetic). Liposuction may be done to reduce the size of the lipoma before it is removed through surgery, or it may be done to remove the lipoma. Lipomas are removed with this method to limit incision size and scarring. A liposuction tube is  inserted through a small incision into the lipoma, and the contents of the lipoma are removed through the tube with suction. Follow these instructions at home: Watch your lipoma for any changes. Keep all follow-up visits. This is important. Where to find more information OrthoInfo: orthoinfo.aaos.org Contact a health care provider if: Your lipoma becomes larger or hard. Your lipoma becomes painful, red, or increasingly swollen. These could be signs of infection or a more serious condition. Get help right away if: You develop tingling or numbness in an area near the lipoma. This could indicate that the lipoma is causing nerve damage. Summary A lipoma is a noncancerous tumor that is made up of fat cells. Most lipomas do not cause problems and do not require treatment. If a lipoma is bigger or it causes problems, surgery may be done to remove the lipoma. Contact a health care provider if your lipoma becomes larger or hard, or if it becomes painful, red, or increasingly swollen. These could be signs of infection or a more serious condition. This information is not intended to replace advice given to you by your health care provider. Make sure you discuss any questions you have with your health care provider. Document Revised: 04/02/2021 Document Reviewed: 04/02/2021 Elsevier Patient Education  2024 Elsevier Inc.Scabies, Adult  Scabies is a skin condition that happens when very small insects called mites get under your skin. This causes severe itchiness and a rash that looks like pimples. Scabies is contagious. This means it can  spread easily from person to person. If you get scabies, the people you live with may get it too. With the right treatment, symptoms often go away in 2-4 weeks. In most cases, scabies does not cause lasting problems. What are the causes? Scabies is caused by tiny mites (Sarcoptes scabiei) that can only be seen with a microscope. The mites get into the top layer of your  skin and lay eggs. This is called an infestation. You may get scabies if: You have close contact with someone who has scabies. You come in contact with items that have the mites on them. These may include towels, bedding, or clothes. What increases the risk? You may be more likely to get scabies if: You live in a nursing home or extended care facility. You spend time in a place where a lot of people live close together, such as a shelter or prison. You have sex with a partner who has scabies. You care for others who are at risk for scabies. What are the signs or symptoms? Symptoms of scabies include: A rash that looks like pimples. It may include tiny red bumps or blisters. It is often found in the skinfolds or on the hands, wrists, elbows, armpits, chest, waist, groin, or buttocks. Severe itchiness. This is often worse at night. Skin irritation. This can include scaly patches or sores. The bumps from scabies may form a line (burrow) on the skin. The line may look thin, crooked, and grayish-white or skin colored. How is this diagnosed? Scabies may be diagnosed based on a physical exam of your skin. You may also have a skin test done. A sample of your skin may be taken (skin scraping) and looked at under a microscope for signs of mites. How is this treated? Scabies may be treated with: Medicated creams or lotions to kill the mites. The cream or lotion is spread on your whole body and left for a few hours. In most cases, one treatment is enough to kill all the mites. In severe cases, the treatment may need to be done more than once. Medicated cream to help with the itching. Medicines taken by mouth (orally). These may help: Relieve itching. Reduce the swelling and redness. Kill the mites. This treatment may be used in severe cases. Follow these instructions at home: Medicines Take or apply over-the-counter and prescription medicines only as told by your health care provider. Apply medicated  cream or lotion as told by your provider. Do not wash off the medicated cream or lotion until enough time has passed or as told by your provider. Skin care Try not to scratch or pick at the affected areas of your skin. Keep your fingernails closely trimmed. This can help reduce injury from scratching. Take cool baths or apply cool, wet cloths to your skin. This can help reduce itching. General instructions Clean all items that you touched in the 3 days before you were diagnosed. This includes bedding, clothes, towels, and furniture. Do this on the same day that you start treatment. Dry-clean items or use hot water to wash them. Dry them on the hot dry cycle. Place items that cannot be washed into closed, airtight plastic bags for at least 3 days. The mites cannot live for more than 3 days away from human skin. Vacuum your furniture and mattresses. Make sure that other people who may have been infested see a provider. Where to find more information Centers for Disease Control and Prevention (CDC): tonerpromos.no Contact a health care provider  if: You have itching that does not go away after 4 weeks of treatment. You keep getting new bumps or burrows. You have redness, swelling, or pain near your rash after treatment. You have fluid, blood, or pus coming from your rash. You get thick crusts or scaly patches over large areas of your skin. You have a fever. This information is not intended to replace advice given to you by your health care provider. Make sure you discuss any questions you have with your health care provider. Document Revised: 12/20/2021 Document Reviewed: 12/20/2021 Elsevier Patient Education  2024 [employer].

## 2024-01-24 ENCOUNTER — Telehealth: Payer: Self-pay

## 2024-01-24 DIAGNOSIS — B86 Scabies: Secondary | ICD-10-CM

## 2024-01-24 MED ORDER — PERMETHRIN 5 % EX CREA
1.0000 | TOPICAL_CREAM | Freq: Once | CUTANEOUS | 0 refills | Status: AC
Start: 2024-01-24 — End: 2024-01-24

## 2024-01-24 NOTE — Telephone Encounter (Signed)
 Copied from CRM [id number]. Topic: General - Other >> Jan 24, 2024 10:40 AM Travis F wrote: Reason for CRM: Patient is calling in checking on the status of his request for a new prescription for permethrin (ELIMITE) 5 % cream

## 2024-01-24 NOTE — Addendum Note (Signed)
 Addended by: JOYCE NORLEEN BROCKS on: 01/24/2024 11:22 AM   Modules accepted: Orders

## 2024-02-21 ENCOUNTER — Ambulatory Visit: Admitting: Family Medicine

## 2024-02-21 ENCOUNTER — Encounter: Payer: Self-pay | Admitting: Family Medicine

## 2024-02-21 VITALS — BP 120/76 | HR 80 | Ht 70.0 in | Wt 204.6 lb

## 2024-02-21 DIAGNOSIS — L308 Other specified dermatitis: Secondary | ICD-10-CM

## 2024-02-21 MED ORDER — CLOBETASOL PROPIONATE 0.05 % EX CREA
1.0000 | TOPICAL_CREAM | Freq: Two times a day (BID) | CUTANEOUS | 0 refills | Status: AC
Start: 2024-02-21 — End: ?

## 2024-02-21 NOTE — Progress Notes (Signed)
   Subjective:    Patient ID: Benjamin Macias, male    DOB: [birth year], 63 y.o.   MRN: [id number]  HPI He is here for consult concerning concerning difficulty with lesions present on the hands especially over the left hypothenar area.  He also has some lesions present in the palm of the hand and 1 on his plantar surface of the foot.  He has tried topical steroids fairly regularly without much benefit.  He also tried Elimite  to treat the scabies and then again had no improvement.   Review of Systems     Objective:    Physical Exam        Assessment & Plan:  Other eczema - Plan: clobetasol  cream (TEMOVATE ) 0.05 % also recommend he try CeraVe.  He will keep us  informed of how he is doing and possibly refer to dermatology.

## 2024-02-21 NOTE — Patient Instructions (Signed)
 Use CERA with hydro urea

## 2024-04-08 ENCOUNTER — Ambulatory Visit: Admitting: Family Medicine

## 2024-04-08 ENCOUNTER — Encounter: Payer: Self-pay | Admitting: Family Medicine

## 2024-04-08 ENCOUNTER — Ambulatory Visit
Admission: RE | Admit: 2024-04-08 | Discharge: 2024-04-08 | Disposition: A | Source: Ambulatory Visit | Attending: Family Medicine

## 2024-04-08 ENCOUNTER — Ambulatory Visit: Payer: Self-pay

## 2024-04-08 VITALS — BP 122/78 | HR 72 | Ht 70.0 in | Wt 214.8 lb

## 2024-04-08 DIAGNOSIS — R202 Paresthesia of skin: Secondary | ICD-10-CM

## 2024-04-08 DIAGNOSIS — L308 Other specified dermatitis: Secondary | ICD-10-CM

## 2024-04-08 DIAGNOSIS — M5412 Radiculopathy, cervical region: Secondary | ICD-10-CM | POA: Diagnosis not present

## 2024-04-08 DIAGNOSIS — R21 Rash and other nonspecific skin eruption: Secondary | ICD-10-CM

## 2024-04-08 MED ORDER — METHYLPREDNISOLONE 4 MG PO TBPK: ORAL_TABLET | ORAL | 0 refills | Status: AC

## 2024-04-08 NOTE — Patient Instructions (Addendum)
 Please go get your xrays done at [facility]. You do not need to make an appointment. You can just show up.   Address: [address] [place], [place], [place] [address]  VISIT SUMMARY:  During your visit, we discussed the discomfort and tingling in your left arm that you have been experiencing, primarily at night. We also reviewed your history of eczema and its management.  YOUR PLAN:  -CERVICAL RADICULITIS: Cervical radiculitis is a condition where a nerve in the neck is compressed, causing pain and tingling down the arm. We suspect this is the cause of your left arm symptoms. We have prescribed a Medrol  Dosepak, which is a short course of steroids to help reduce inflammation. We have also ordered neck x-rays to check for any structural changes. If needed, we will plan for an MRI of your neck. If your symptoms persist, physical therapy may be recommended.  -ECZEMA: Eczema is a skin condition that causes patches of skin to become inflamed, itchy, and red. You have a history of severe eczema with recurrent flares. Continue using topical steroids during flare-ups and moisturizing with CeraVe. We have referred you to a dermatologist for further evaluation and management.  INSTRUCTIONS:  Please follow up with the neck x-rays as ordered. If the x-rays indicate the need, we will proceed with an MRI of your neck. Continue using the Medrol  Dosepak as prescribed. For your eczema, continue with the topical steroids during flare-ups and keep moisturizing with CeraVe. Follow up with the dermatologist as referred for further evaluation.

## 2024-04-08 NOTE — Telephone Encounter (Signed)
 FYI Only or Action Required?: FYI only for provider: appointment scheduled on 04/08/24.  Patient was last seen in primary care on 02/21/2024 by Joyce Norleen BROCKS, MD.  Called Nurse Triage reporting Numbness.  Symptoms began several weeks ago.  Interventions attempted: Nothing.  Symptoms are: stable.  Triage Disposition: See PCP When Office is Open (Within 3 Days)  Patient/caregiver understands and will follow disposition?: Yes  Reason for Disposition  [1] Numbness or tingling in one or both hands AND [2] is a chronic symptom (recurrent or ongoing AND present > 4 weeks)  Answer Assessment - Initial Assessment Questions Pt reports being seen for a lump on left elbow that appeared a few months ago, unsure if its pressing on nerve  1. SYMPTOM: What is the main symptom you are concerned about? (e.g., weakness, numbness)     Left hand tingling  2. ONSET: When did this start? (e.g., minutes, hours, days; while sleeping)     2 weeks   4. PATTERN Does this come and go, or has it been constant since it started?  Is it present now?     Not noticeable during the day, happens at night, has to constantly move arm or keep it elevated  5. CARDIAC SYMPTOMS: Have you had any of the following symptoms: chest pain, difficulty breathing, palpitations?     Denies  6. NEUROLOGIC SYMPTOMS: Have you had any of the following symptoms: headache, dizziness, vision loss, double vision, changes in speech, unsteady on your feet?     Denies  7. OTHER SYMPTOMS: Do you have any other symptoms?     Denies  Protocols used: Neurologic [facility]  Copied from CRM [id number]. Topic: Clinical - Red Word Triage >> Apr 08, 2024 10:07 AM Delon HERO wrote: Red Word that prompted transfer to Nurse Triage: Patient is calling to report  Left hand tingling for 2 weeks. More noticeable at night and have to lift up hand.

## 2024-04-08 NOTE — Progress Notes (Signed)
" ° °  Name: Benjamin Macias   Date of Visit: 04/08/2024   Date of last visit with me: Visit date not found   CHIEF COMPLAINT:  Chief Complaint  Patient presents with   other    Left hand tingling at night, any position he sleeps it happens, started a couple of weeks ago       HPI:  Discussed the use of AI scribe software for clinical note transcription with the patient, who gave verbal consent to proceed.  History of Present Illness   Benjamin Macias is a 64 year old male who presents with left arm discomfort and tingling.  He has been experiencing discomfort and tingling in his left arm, primarily at night, for the past couple of weeks. The sensation is described as shooting down the arm with tingling, but not originating from the neck or elbow. No neck pain is reported, and there is no tenderness in the elbow area.  The symptoms are most pronounced at night, regardless of his sleeping position, and he uses a non-flat pillow. He has previously used a steroid for a different condition, which initially helped, but the symptoms recurred. He has been applying the steroid again with some improvement.  He has a history of eczema, for which he uses CeraVe moisturizer and topical steroids during flare-ups.         OBJECTIVE:       06/27/2023    3:14 PM  Depression screen PHQ 2/9  Decreased Interest 0  Down, Depressed, Hopeless 0  PHQ - 2 Score 0     BP Readings from Last 3 Encounters:  04/08/24 122/78  02/21/24 120/76  01/23/24 136/78    BP 122/78   Pulse 72   Ht 5' 10 (1.778 m)   Wt 214 lb 12.8 oz (97.4 kg)   SpO2 96%   BMI 30.82 kg/m    Physical Exam          Physical Exam  Numbness of the left upper extremity.  No tenderness over/neck.  Positive Spurling test on the left side ASSESSMENT/PLAN:   Assessment & Plan Hand tingling  Cervical radiculitis  Skin rash  Other eczema    Assessment and Plan    Cervical radiculitis Suspected cervical radiculitis with  left arm tingling and discomfort, likely due to nerve compression from the neck. Differential includes carpal tunnel syndrome, but symptoms suggest cervical origin. - Prescribed Medrol  Dosepak for short course of steroids. - Ordered neck x-rays to assess structural changes. - Plan MRI of the neck if x-rays indicate need. - Advised potential physical therapy if symptoms persist.  Eczema Severe eczema with recurrent flares. Previous topical steroids provided relief, but symptoms returned. Referral to dermatologist for further management. - Continue topical steroids during flares. - Referred to dermatologist for further evaluation. - Continue moisturizing with CeraVe.         Florrie Ramires A. Vita MD [facility] "

## 2024-04-09 ENCOUNTER — Ambulatory Visit: Payer: Self-pay | Admitting: Family Medicine

## 2024-04-19 ENCOUNTER — Other Ambulatory Visit: Payer: Self-pay | Admitting: Medical

## 2024-04-19 DIAGNOSIS — M542 Cervicalgia: Secondary | ICD-10-CM

## 2024-04-19 NOTE — Telephone Encounter (Signed)
I mean MRI.

## 2024-04-26 ENCOUNTER — Ambulatory Visit
Admission: RE | Admit: 2024-04-26 | Discharge: 2024-04-26 | Disposition: A | Source: Ambulatory Visit | Attending: Medical | Admitting: Medical

## 2024-04-26 DIAGNOSIS — M542 Cervicalgia: Secondary | ICD-10-CM

## 2024-05-01 ENCOUNTER — Ambulatory Visit: Payer: Self-pay | Admitting: Medical

## 2024-05-01 DIAGNOSIS — R202 Paresthesia of skin: Secondary | ICD-10-CM

## 2024-05-01 DIAGNOSIS — M542 Cervicalgia: Secondary | ICD-10-CM

## 2024-05-01 DIAGNOSIS — M5412 Radiculopathy, cervical region: Secondary | ICD-10-CM

## 2024-05-01 NOTE — Progress Notes (Signed)
 Results through MyChart

## 2024-06-28 ENCOUNTER — Encounter: Payer: Self-pay | Admitting: Family Medicine

## 2024-07-01 ENCOUNTER — Encounter: Admitting: Family Medicine
